# Patient Record
Sex: Male | Born: 1935 | Race: White | Hispanic: No | Marital: Married | Smoking: Never smoker
Health system: Southern US, Community
[De-identification: ages and names within clinical notes are randomized; demographics above are authoritative.]

## PROBLEM LIST (undated history)

## (undated) DIAGNOSIS — M1990 Unspecified osteoarthritis, unspecified site: Secondary | ICD-10-CM

## (undated) DIAGNOSIS — L719 Rosacea, unspecified: Secondary | ICD-10-CM

## (undated) DIAGNOSIS — N200 Calculus of kidney: Secondary | ICD-10-CM

## (undated) DIAGNOSIS — Z973 Presence of spectacles and contact lenses: Secondary | ICD-10-CM

## (undated) DIAGNOSIS — Z974 Presence of external hearing-aid: Secondary | ICD-10-CM

## (undated) DIAGNOSIS — K219 Gastro-esophageal reflux disease without esophagitis: Secondary | ICD-10-CM

## (undated) DIAGNOSIS — E785 Hyperlipidemia, unspecified: Secondary | ICD-10-CM

## (undated) DIAGNOSIS — Z972 Presence of dental prosthetic device (complete) (partial): Secondary | ICD-10-CM

## (undated) DIAGNOSIS — H9190 Unspecified hearing loss, unspecified ear: Secondary | ICD-10-CM

## (undated) DIAGNOSIS — C61 Malignant neoplasm of prostate: Secondary | ICD-10-CM

## (undated) DIAGNOSIS — K08109 Complete loss of teeth, unspecified cause, unspecified class: Secondary | ICD-10-CM

## (undated) DIAGNOSIS — I10 Essential (primary) hypertension: Secondary | ICD-10-CM

## (undated) HISTORY — DX: Rosacea, unspecified: L71.9

## (undated) HISTORY — PX: INGUINAL HERNIA REPAIR: SUR1180

## (undated) HISTORY — DX: Calculus of kidney: N20.0

## (undated) HISTORY — PX: EYE SURGERY: SHX253

## (undated) HISTORY — DX: Hyperlipidemia, unspecified: E78.5

## (undated) HISTORY — DX: Gastro-esophageal reflux disease without esophagitis: K21.9

## (undated) HISTORY — DX: Malignant neoplasm of prostate: C61

## (undated) HISTORY — PX: APPENDECTOMY: SHX54

---

## 2002-08-16 ENCOUNTER — Emergency Department (HOSPITAL_COMMUNITY): Admission: EM | Admit: 2002-08-16 | Discharge: 2002-08-16 | Payer: Self-pay | Admitting: Emergency Medicine

## 2002-08-16 ENCOUNTER — Encounter: Payer: Self-pay | Admitting: Emergency Medicine

## 2002-08-18 ENCOUNTER — Encounter: Payer: Self-pay | Admitting: Internal Medicine

## 2002-08-18 ENCOUNTER — Ambulatory Visit (HOSPITAL_COMMUNITY): Admission: RE | Admit: 2002-08-18 | Discharge: 2002-08-18 | Payer: Self-pay | Admitting: Internal Medicine

## 2003-01-12 ENCOUNTER — Ambulatory Visit (HOSPITAL_COMMUNITY): Admission: RE | Admit: 2003-01-12 | Discharge: 2003-01-12 | Payer: Self-pay | Admitting: Internal Medicine

## 2004-02-15 ENCOUNTER — Ambulatory Visit (HOSPITAL_COMMUNITY): Admission: RE | Admit: 2004-02-15 | Discharge: 2004-02-15 | Payer: Self-pay | Admitting: Internal Medicine

## 2004-03-22 ENCOUNTER — Ambulatory Visit (HOSPITAL_COMMUNITY): Admission: RE | Admit: 2004-03-22 | Discharge: 2004-03-22 | Payer: Self-pay | Admitting: Internal Medicine

## 2004-04-08 ENCOUNTER — Ambulatory Visit (HOSPITAL_COMMUNITY): Admission: RE | Admit: 2004-04-08 | Discharge: 2004-04-08 | Payer: Self-pay | Admitting: Internal Medicine

## 2004-08-07 HISTORY — PX: COLONOSCOPY: SHX174

## 2004-08-07 HISTORY — PX: ESOPHAGOGASTRODUODENOSCOPY: SHX1529

## 2005-02-14 ENCOUNTER — Observation Stay (HOSPITAL_COMMUNITY): Admission: RE | Admit: 2005-02-14 | Discharge: 2005-02-15 | Payer: Self-pay | Admitting: General Surgery

## 2005-05-31 ENCOUNTER — Ambulatory Visit (HOSPITAL_COMMUNITY): Admission: RE | Admit: 2005-05-31 | Discharge: 2005-05-31 | Payer: Self-pay | Admitting: Internal Medicine

## 2005-06-13 ENCOUNTER — Encounter (HOSPITAL_COMMUNITY): Admission: RE | Admit: 2005-06-13 | Discharge: 2005-07-18 | Payer: Self-pay | Admitting: Internal Medicine

## 2006-05-12 IMAGING — CR DG THORACIC SPINE 2V
3 series · 3 of 3 positions shown · non-contrast
Comparison: none

CLINICAL DATA: Posterior chest pain vs. upper back pain over the past 3 weeks, no known injury.
 TWO VIEWS OF THE CHEST ? 02/15/2004
 No comparison.
 FINDINGS
 The heart size is normal.  The thoracic aorta is tortuous and atherosclerotic.  The hila and mediastinal contours are otherwise unremarkable.  Bronchovascular markings are mildly prominent diffusely.  There is a nodular opacity projected over the left lung base on the frontal view, not seen on the lateral view, which may represent the patient?s nipple.  Degenerative changes are present throughout the thoracic spine.
 IMPRESSION
 1. Probable nipple shadow overlying the left lung base on the PA film; repeat examination with nipple marker would be suggested for confirmation.
 2. No acute cardiopulmonary disease.
 THORACIC SPINE, THREE VIEWS
 Twelve rib-bearing thoracic vertebrae demonstrate anatomic posterior alignment.  No fractures identified.  Diffuse hypertrophic and degenerative changes are present throughout the thoracic spine.  Severe degenerative disc disease and spondylosis is also present at the C6-7 level, noted on the Swimmer?s view.
 Diffuse thoracic spondylosis.  No acute skeletal abnormalities.  Severe degenerative disc disease and spondylosis at C6-7.
 The above results were faxed to Dr. Mellee?[REDACTED] at the time of interpretation on 02/15/2004 at 5188 hours.

[view not recorded (1 of 3)]
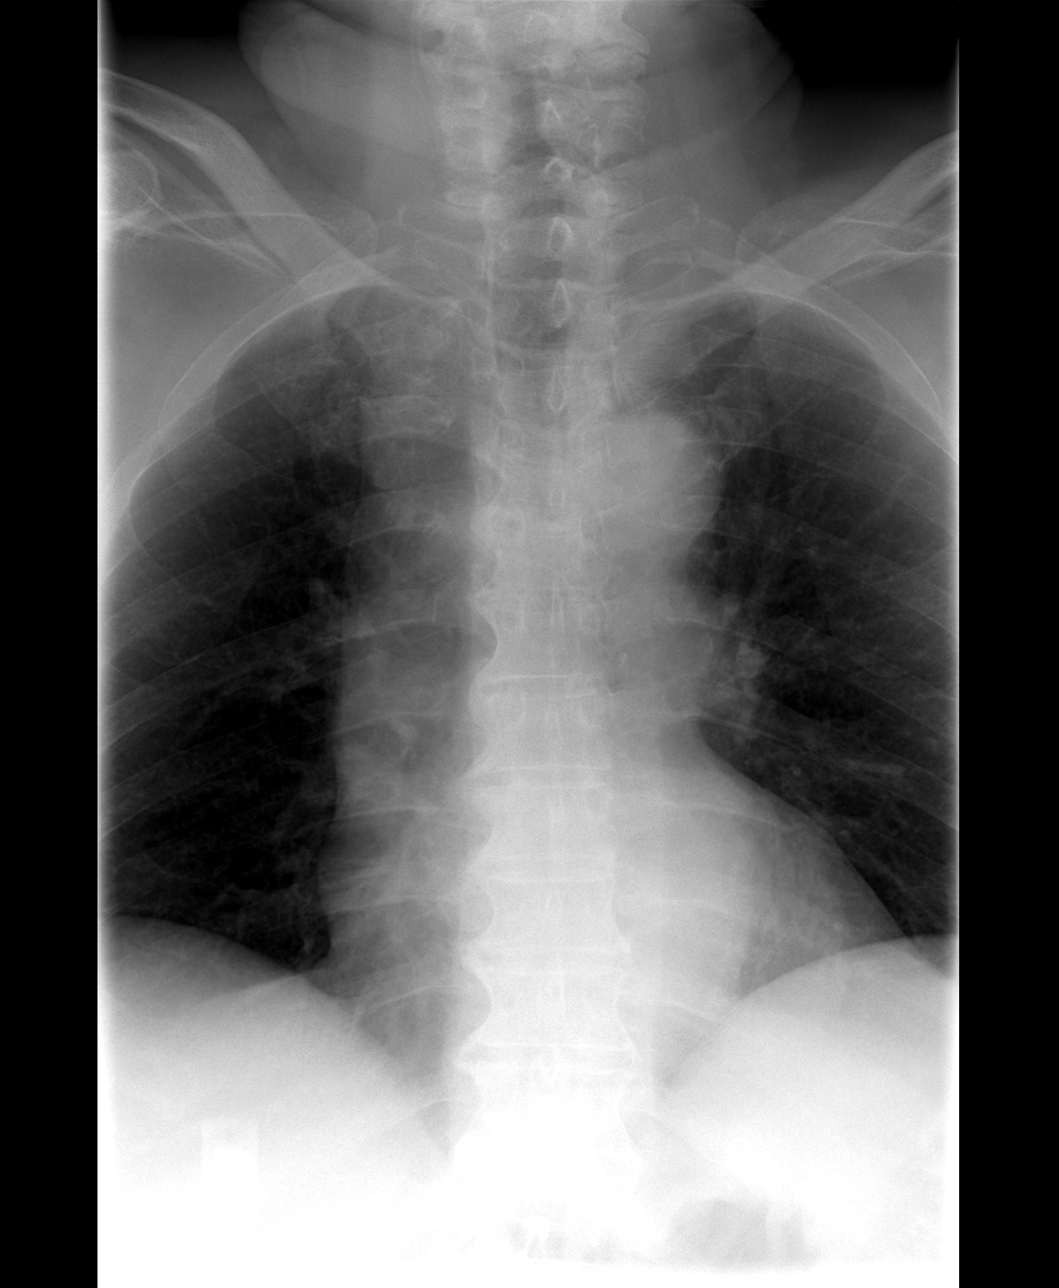

[view not recorded (2 of 3)]
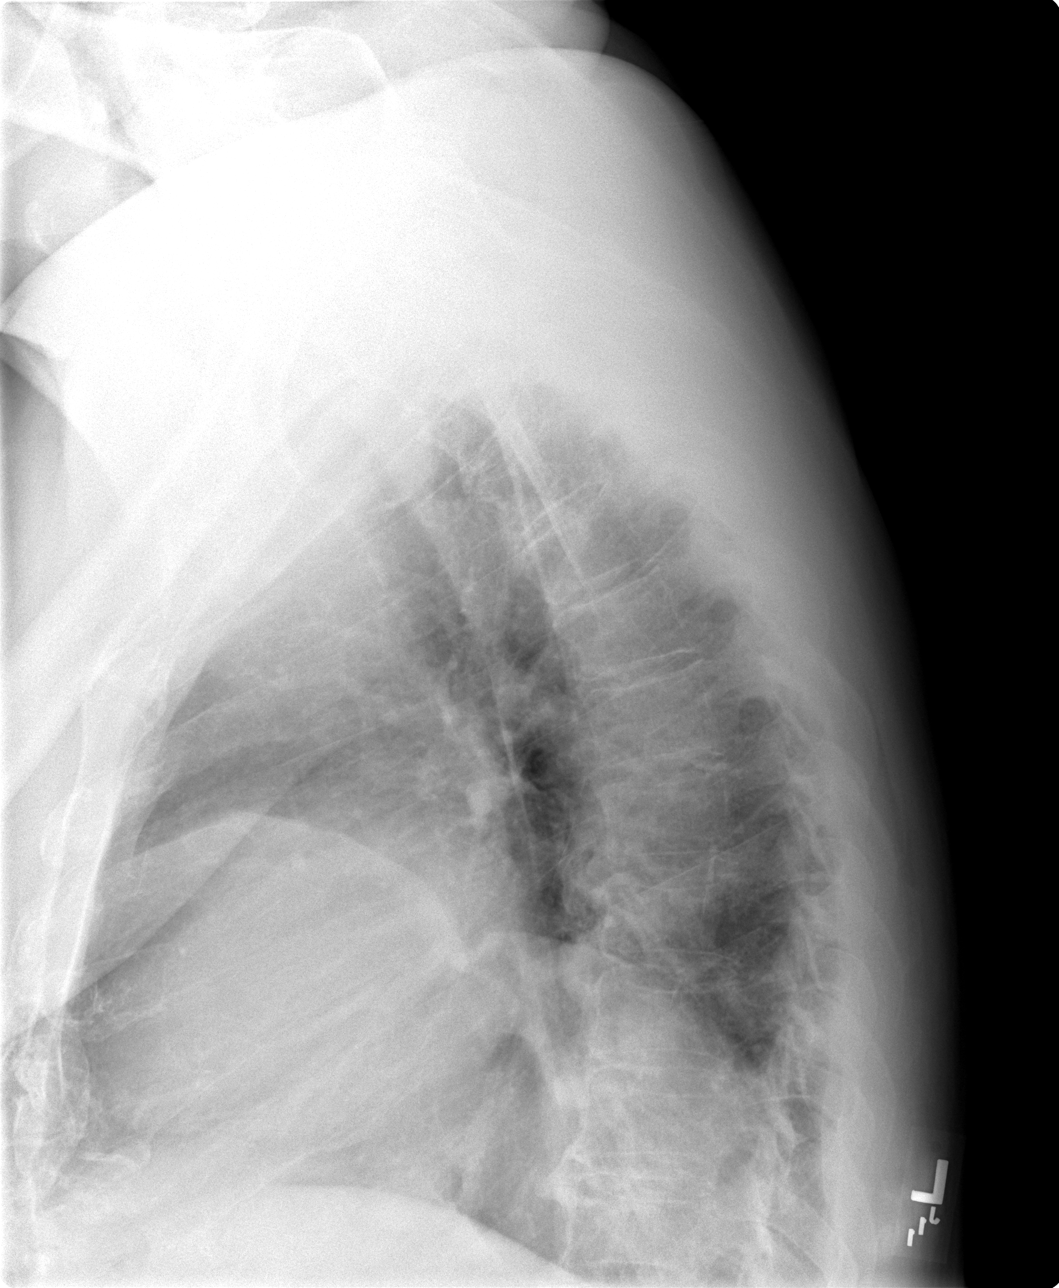

[view not recorded (3 of 3)]
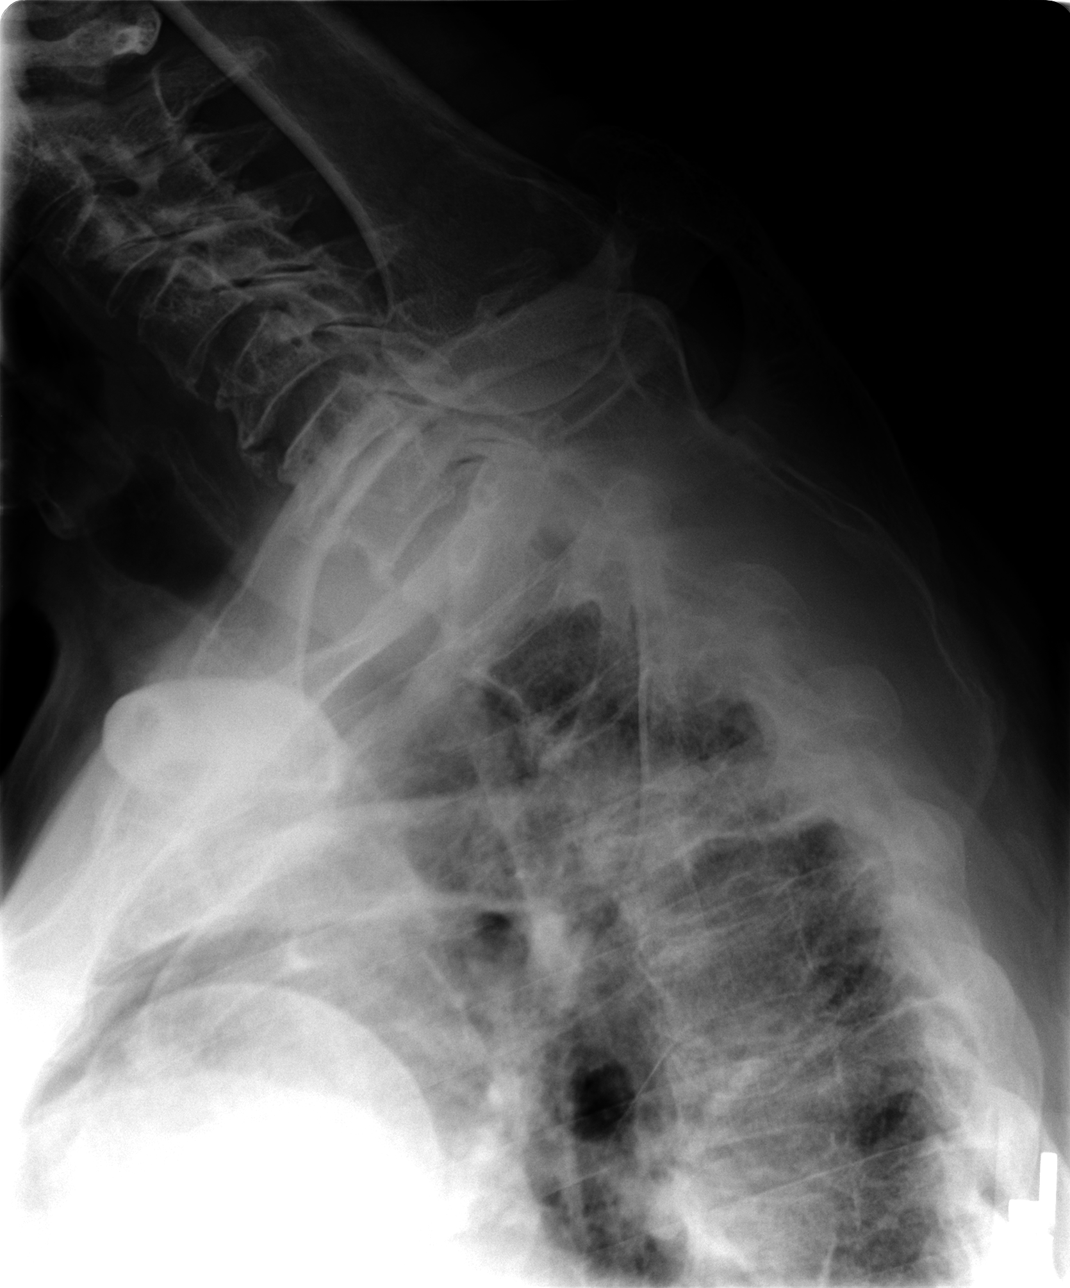

[3 of 3 positions shown; findings below may reference images not displayed]

## 2007-04-30 ENCOUNTER — Ambulatory Visit (HOSPITAL_COMMUNITY): Admission: RE | Admit: 2007-04-30 | Discharge: 2007-04-30 | Payer: Self-pay | Admitting: Internal Medicine

## 2007-08-08 HISTORY — PX: COLONOSCOPY: SHX174

## 2008-02-24 ENCOUNTER — Encounter: Payer: Self-pay | Admitting: Internal Medicine

## 2008-02-24 ENCOUNTER — Ambulatory Visit: Payer: Self-pay | Admitting: Internal Medicine

## 2008-02-24 ENCOUNTER — Ambulatory Visit (HOSPITAL_COMMUNITY): Admission: RE | Admit: 2008-02-24 | Discharge: 2008-02-24 | Payer: Self-pay | Admitting: Internal Medicine

## 2008-04-09 ENCOUNTER — Ambulatory Visit (HOSPITAL_COMMUNITY): Admission: RE | Admit: 2008-04-09 | Discharge: 2008-04-09 | Payer: Self-pay | Admitting: Internal Medicine

## 2010-07-07 ENCOUNTER — Ambulatory Visit (HOSPITAL_COMMUNITY)
Admission: RE | Admit: 2010-07-07 | Discharge: 2010-07-07 | Payer: Self-pay | Source: Home / Self Care | Admitting: Ophthalmology

## 2010-08-29 ENCOUNTER — Ambulatory Visit (HOSPITAL_COMMUNITY)
Admission: RE | Admit: 2010-08-29 | Discharge: 2010-08-29 | Payer: Self-pay | Source: Home / Self Care | Attending: Ophthalmology | Admitting: Ophthalmology

## 2010-08-30 LAB — GLUCOSE, CAPILLARY: Glucose-Capillary: 213 mg/dL — ABNORMAL HIGH (ref 70–99)

## 2010-10-17 LAB — GLUCOSE, CAPILLARY: Glucose-Capillary: 136 mg/dL — ABNORMAL HIGH (ref 70–99)

## 2010-10-18 LAB — BASIC METABOLIC PANEL
BUN: 12 mg/dL (ref 6–23)
CO2: 29 mEq/L (ref 19–32)
Calcium: 9.6 mg/dL (ref 8.4–10.5)
Chloride: 104 mEq/L (ref 96–112)
Creatinine, Ser: 0.88 mg/dL (ref 0.4–1.5)
GFR calc Af Amer: >60 mL/min (ref >60)
GFR calc non Af Amer: >60 mL/min (ref >60)
Glucose, Bld: 91 mg/dL (ref 70–99)
Potassium: 4.1 mEq/L (ref 3.5–5.1)
Sodium: 141 mEq/L (ref 135–145)

## 2010-10-18 LAB — HEMOGLOBIN AND HEMATOCRIT, BLOOD
HCT: 38.3 % — ABNORMAL LOW (ref 39.0–52.0)
Hemoglobin: 13.6 g/dL (ref 13.0–17.0)

## 2010-12-20 NOTE — Op Note (Signed)
NAME:  Ian Mccann, Ian Mccann                  ACCOUNT NO.:  [id number]   MEDICAL RECORD NO.:  [id number]          PATIENT TYPE:  AMB   LOCATION:  DAY                           FACILITY:  [facility]   PHYSICIAN:  R. Roetta Sessions, M.D. DATE OF BIRTH:  1929-05-11   DATE OF PROCEDURE:  DATE OF DISCHARGE:                               OPERATIVE REPORT   INDICATIONS FOR PROCEDURE:  The patient is a 78-year gentleman with a  history of colonic adenomas removed in 2004 and 2001.  He is here for  surveillance.  He does not have any lower GI tract symptoms.  He denies  any major medical issues in the past 5 years except for inguinal hernia  repair.  He does not have any lower GI tract symptoms.  Colonoscopy is  now being done.  This approach was discussed the patient at length.  Risks, benefits, and alternatives have been reviewed, questions  answered.  He is agreeable.  Please see documentation in the medical  record.   PROCEDURE NOTE:  O2 saturation, blood pressure, and pulse of the patient  monitored throughout the entire procedure.  Conscious sedation, Versed 3  mg IV and Demerol 75 mg IV in divided doses.   INSTRUMENT:  Pentax video chip system.   FINDINGS:  Digital rectal exam revealed no abnormalities.  Endoscopic  findings: The prep was adequate.  Colon:  Colonic mucosa was surveyed  from the rectosigmoid junction through the left transverse, right colon,  appendiceal orifice, ileocecal valve, and cecum.  These structures were  well seen and photographed for the record.  From this level, scope was  slowly withdrawn.  All previously mentioned mucosal surfaces were again  seen.  The patient had two diminutive polyps in the base of the cecum,  cold biopsied/removed.  At the splenic flexure, he had two 8-mm  pedunculated polyps, which were both hot snared and recovered through  the scope.  He had also narrow mouthed shallow left-sided diverticula as  well.  Remainder of colonic mucosa appeared normal.   The scope was  pulled down to the rectum with examination of rectal mucosa, including  retroflexed view of the anal verge, demonstrated no abnormalities.  The  patient tolerated the procedure well and was reacted in Endoscopy.   IMPRESSION:  1. Normal rectum.  2. Shallow narrow-mouth left-sided diverticula.  3. Splenic flexure polyp, status post snare polypectomy.  4. Diminutive cecal polyp, status post cold biopsy removal.  5. Colonic mucosa appeared normal.   RECOMMENDATIONS:  1. Follow up diverticulosis.  Literature provided with Mr. Skillin.  2. Follow up on path.  3. Further recommendations to follow.      Jonathon Bellows, M.D.  Electronically Signed     RMR/MEDQ  D:  02/24/2008  T:  02/25/2008  Job:  [id number]   cc:   Kingsley Callander. Ouida Sills, MD  Fax: [phone]

## 2010-12-23 NOTE — Op Note (Signed)
NAME:  Ian Mccann, Ian Mccann                  ACCOUNT NO.:  [id number]   MEDICAL RECORD NO.:  [id number]          PATIENT TYPE:  AMB   LOCATION:  DAY                           FACILITY:  [facility]   PHYSICIAN:  Jerolyn Shin C. Katrinka Blazing, M.D.   DATE OF BIRTH:  1929/02/26   DATE OF PROCEDURE:  02/14/2005  DATE OF DISCHARGE:                                 OPERATIVE REPORT   PREOPERATIVE DIAGNOSIS:  Recurrent right inguinal hernia.   POSTOPERATIVE DIAGNOSIS:  Recurrent right inguinal hernia.   OPERATION/PROCEDURE:  Repair recurrent right inguinal hernia with mesh  graft.   SURGEON:  Dirk Dress. Katrinka Blazing, M.D.   DESCRIPTION OF PROCEDURE:  Under spinal anesthesia, the right inguinal area  was prepped and draped in the sterile field.  The old incision was opened.  Incision was extended down to the aponeurosis.  The aponeurosis was very  thickened  and scarred down.  It was gently dissected until the cord could  be identified.  The cord was separated from the surrounding structures.  There was a large lipoma pushing through the inguinal floor medial to the  cord.  This was dissected away from the cord and fixated through the  inguinal floor in a more medial position close to the pubic tubercle.  There  were some sutures present which looked like Ethibond or similar type of  suture.  I could not actually determine the type of repair though it appears  that it may have been a Engineer, manufacturing or an old Barista.  The inguinal  floor was dissected.  The transverse segment of the fascia was followed down  to Cooper's ligament.  Once this was done, the remnant of the conjoin tendon  was identified.  The large lipoma was invaginated and a large mesh plug was  placed and sutured in place with interrupted 0 Prolene.  An onlay patch was  placed.  It was sutured to Cooper's ligament and the ileopubic tract as well  as the remnant of the conjoined tendon.  It was sutured lateral to the cord.  It should be noted that the cord  was explored and there was no evidence of  an indirect component.  The cord was placed in the anatomic position over  the repair.  The aponeurosis was closed with 2-0 Monocryl.  Subcutaneous  tissue was closed with 3-0 Monocryl.  Skin was closed with staples.  Local  infiltration  with 0.5% Marcaine with epinephrine was carried out.  Dressing of Op-Site  was placed.  The patient tolerated the procedure well.  He was awakened from  anesthesia, transferred to a bed and taken to the post anesthesia care unit  for monitoring.       LCS/MEDQ  D:  02/14/2005  T:  02/14/2005  Job:  [id number]   cc:   Barbaraann Barthel, M.D.  [address]  [place]  [place] [address]  Fax: [phone]   Kingsley Callander. Ouida Sills, MD  [address]  [place]  [place] [address]  Fax: [phone]

## 2010-12-23 NOTE — Op Note (Signed)
NAME:  Ian Mccann, Ian Mccann                            ACCOUNT NO.:  [id number]   MEDICAL RECORD NO.:  [id number]                   PATIENT TYPE:  AMB   LOCATION:  DAY                                  FACILITY:  [facility]   PHYSICIAN:  R. Roetta Sessions, M.D.              DATE OF BIRTH:  1928/10/13   DATE OF PROCEDURE:  01/12/2003  DATE OF DISCHARGE:                                 OPERATIVE REPORT   PROCEDURE:  Esophagogastroduodenoscopy with Elease Hashimoto dilation followed by  colonoscopy with biopsy.   INDICATIONS FOR PROCEDURE:  The patient is a 77 year old gentleman who  underwent colonoscopy in 2001 and had a pedunculated polyp in his rectum  removed. He comes now for surveillance, also has had had a 77 year old  history of gastroesophageal reflux disease and intermittent esophageal  dysphagia. Reflux symptoms well controlled on Nexium 40 mg orally daily. EGD  is now being done to further evaluate his longstanding reflux and dysphagia  and he is now undergoing surveillance colonoscopy. This approach has been  discussed with the patient at length at the bedside. The potential risks,  benefits, and alternatives have been reviewed, questions answered. He is  agreeable. Please see my handwritten H&P for more information.   MONITORING:  O2 saturation, blood pressure, pulse, and respirations were  monitored throughout the entirety of both procedures.   CONSCIOUS SEDATION:  For both procedures was Versed 2 mg IV, Demerol 50 mg  IV.   INSTRUMENTS:  Olympus video chip adult gastroscope and colonoscope.   EGD FINDINGS:  Examination of the tubular esophagus revealed a subtle  noncritical appearing submucosal ring at the EG junction. The esophageal  mucosa otherwise appeared normal. No evidence of Barrett's esophagus.  The  EG junction was easily traversed with the scope.   STOMACH:  The gastric cavity was empty and insufflated well with air. A  thorough examination of the gastric mucosa including a  retroflexed view of  the proximal stomach and esophagogastric junction demonstrated focal antral  erosions and some white verrucous appearing mucosa in the this area. This  whitish material would not wash off. There was no ulcer infiltrating process  seen. The pylorus was patent and easily traversed.   DUODENUM:  Examination of the bulb and second portion revealed no  abnormalities.   THERAPEUTIC/DIAGNOSTIC MANEUVERS:  A 56 French Maloney dilator was passed to  full insertion with good patient tolerance.  A look back revealed no  apparent complications related to passage of the dilator. Subsequently the  area of abnormality in the antrum was biopsied multiple times for histologic  studies.   The patient tolerated the procedure well and was prepared for colonoscopy.   Digital rectal exam revealed no abnormalities.   ENDOSCOPIC FINDINGS:  The prep was good.   RECTUM:  Examination of the rectal mucosa including retroflexed view of the  anal verge revealed no abnormalities.   COLON:  The colonic mucosa was surveyed from the rectosigmoid junction  through the left transverse right colon to the area of the appendiceal  orifice, ileocecal valve and cecum. These structures were seen and  photographed for the record. The patient had two 5 mm polyps at 40 cm which  were cold biopsied/removed, remainder of the colonic mucosa appeared normal  from the level of the cecum and ileocecal valve. The scope was slowly and  cautiously withdrawn. All previously mentioned mucosal surfaces were again  seen and no other abnormalities were observed. The patient tolerated both  procedures well and was reacted in endoscopy.   IMPRESSION:  1. Esophagogastroduodenoscopy, subtle submucosal ring status post dilation     as described above, otherwise, normal esophagus.  2. Verrucous appearing mucosa with focal erosions in the antrum of uncertain     clinical significance biopsied. The remainder of the gastric  mucosa     appeared normal. Normal D1, D2.   COLONOSCOPY FINDINGS:  1. Normal rectum.  2. Small polyps at 40 cm cold biopsied/removed. The remainder of the colonic     mucosa appeared normal.   RECOMMENDATIONS:  1. Continue Nexium 40 mg orally daily, will followup on path.  2. Further recommendations to follow.                                               Ian Mccann, M.D.    RMR/MEDQ  D:  01/12/2003  T:  01/12/2003  Job:  [id number]   cc:   Kingsley Callander. Ouida Sills, M.D.  [address]  [place]  [place] [address]  Fax: [phone]

## 2010-12-23 NOTE — H&P (Signed)
NAME:  Ian Mccann, Ian Mccann                  ACCOUNT NO.:  [id number]   MEDICAL RECORD NO.:  [id number]          PATIENT TYPE:  AMB   LOCATION:  DAY                           FACILITY:  [facility]   PHYSICIAN:  Jerolyn Shin C. Katrinka Blazing, M.D.   DATE OF BIRTH:  1929-04-29   DATE OF ADMISSION:  DATE OF DISCHARGE:  LH                                HISTORY & PHYSICAL   A 77 year old male with history of painful swelling in his right groin for  up to two years. He has not noticed a mass. The patient had a right inguinal  hernia repair done in 1990 by Dr. Malvin Johns. He is having increasing symptoms  and is scheduled for repair of recurrent right inguinal hernia.   PAST HISTORY:  1.  He has diabetes mellitus.  2.  Chronic bronchitis.  3.  Gastroesophageal reflux disease.  4.  Constipation.   MEDICATIONS:  Medications are listed, but doses are not given. Medications  include:  1.  Nexium 40 mg daily.  2.  Metformin 1,000 mg twice daily.  3.  Mavik 2 mg daily.  4.  Lipitor 20 mg q.h.s.  5.  Doxazosin 8 mg daily.  6.  Aspirin 325 mg daily.  7.  Darvocet N 100 p.r.n.  8.  Glycalox preparation for constipation.  9.  Citrucel caplets 4 a day.  10. Centrum Silver 1 day.  11. Vitamin E 1 a day.   SURGERY:  1.  Left hydrocele, left inguinal hernia 1968.  2.  Right inguinal hernia 1990.   SOCIAL HISTORY:  He is retired Geophysicist/field seismologist. Married. Does not drink, smoke  or use drugs.   PHYSICAL EXAMINATION:  VITAL SIGNS:  On exam, blood pressure 124/68, pulse  62, respirations 20, weight 204 pounds.  HEENT is unremarkable.  NECK:  Supple. No JVD, bruit, adenopathy or thyromegaly.  CHEST:  Clear to auscultation.  HEART:  Regular rate and rhythm without murmur, gallop or rub.  ABDOMEN:  Soft, nontender, no masses.  GENITOURINARY:  Genitalia exam reveals a right inguinal hernia and a left  varicocele. Otherwise genitalia unremarkable.   IMPRESSION:  1.  Right inguinal hernia, recurrent.  2.  Hypertension.  3.   Gastroesophageal reflux disease.  4.  Benign prostatic hypertrophy.  5.  Diabetes mellitus.  6.  Chronic bronchitis.  7.  Chronic constipation.   PLAN:  Repair of recurrent right inguinal hernia.       LCS/MEDQ  D:  02/13/2005  T:  02/14/2005  Job:  [id number]   cc:   [facility]  Fax: [phone]

## 2011-07-18 ENCOUNTER — Ambulatory Visit (INDEPENDENT_AMBULATORY_CARE_PROVIDER_SITE_OTHER): Payer: Medicare PPO | Admitting: Urology

## 2011-07-18 ENCOUNTER — Other Ambulatory Visit: Payer: Self-pay | Admitting: Urology

## 2011-07-18 DIAGNOSIS — R972 Elevated prostate specific antigen [PSA]: Secondary | ICD-10-CM

## 2011-07-18 DIAGNOSIS — N401 Enlarged prostate with lower urinary tract symptoms: Secondary | ICD-10-CM

## 2011-07-18 DIAGNOSIS — N402 Nodular prostate without lower urinary tract symptoms: Secondary | ICD-10-CM

## 2011-08-08 DIAGNOSIS — C61 Malignant neoplasm of prostate: Secondary | ICD-10-CM

## 2011-08-08 HISTORY — DX: Malignant neoplasm of prostate: C61

## 2011-08-22 ENCOUNTER — Other Ambulatory Visit: Payer: Self-pay | Admitting: Urology

## 2011-08-22 ENCOUNTER — Encounter (HOSPITAL_COMMUNITY): Payer: Medicare PPO

## 2011-08-22 ENCOUNTER — Ambulatory Visit (HOSPITAL_COMMUNITY)
Admission: RE | Admit: 2011-08-22 | Discharge: 2011-08-22 | Disposition: A | Discharge status: Home / Self Care | Payer: Medicare PPO | Source: Ambulatory Visit | Attending: Urology | Admitting: Urology

## 2011-08-22 DIAGNOSIS — N402 Nodular prostate without lower urinary tract symptoms: Secondary | ICD-10-CM

## 2011-08-23 ENCOUNTER — Other Ambulatory Visit: Payer: Self-pay | Admitting: Urology

## 2011-09-01 ENCOUNTER — Other Ambulatory Visit: Payer: Self-pay | Admitting: Urology

## 2011-09-01 DIAGNOSIS — C61 Malignant neoplasm of prostate: Secondary | ICD-10-CM

## 2011-09-05 ENCOUNTER — Encounter (HOSPITAL_COMMUNITY)
Admission: RE | Admit: 2011-09-05 | Discharge: 2011-09-05 | Disposition: A | Discharge status: Home / Self Care | Payer: Medicare PPO | Source: Ambulatory Visit | Attending: Urology | Admitting: Urology

## 2011-09-05 ENCOUNTER — Encounter (HOSPITAL_COMMUNITY): Payer: Self-pay

## 2011-09-05 ENCOUNTER — Ambulatory Visit (HOSPITAL_COMMUNITY)
Admission: RE | Admit: 2011-09-05 | Discharge: 2011-09-05 | Disposition: A | Discharge status: Home / Self Care | Payer: Medicare PPO | Source: Ambulatory Visit | Attending: Urology | Admitting: Urology

## 2011-09-05 DIAGNOSIS — C61 Malignant neoplasm of prostate: Secondary | ICD-10-CM | POA: Insufficient documentation

## 2011-09-05 DIAGNOSIS — Q619 Cystic kidney disease, unspecified: Secondary | ICD-10-CM | POA: Insufficient documentation

## 2011-09-05 LAB — POCT I-STAT, CHEM 8
BUN: 17 mg/dL (ref 6–23)
Calcium, Ion: 1.04 mmol/L — ABNORMAL LOW (ref 1.12–1.32)
Chloride: 106 mEq/L (ref 96–112)
Creatinine, Ser: 0.8 mg/dL (ref 0.50–1.35)
Glucose, Bld: 229 mg/dL — ABNORMAL HIGH (ref 70–99)
HCT: 40 % (ref 39.0–52.0)
Hemoglobin: 13.6 g/dL (ref 13.0–17.0)
Potassium: 4.2 mEq/L (ref 3.5–5.1)
Sodium: 138 mEq/L (ref 135–145)
TCO2: 22 mmol/L (ref 0–100)

## 2011-09-05 MED ORDER — IOHEXOL 300 MG/ML  SOLN
100.0000 mL | Freq: Once | INTRAMUSCULAR | Status: AC | PRN
  Administered 2011-09-05: 100 mL via INTRAVENOUS

## 2011-09-05 MED ORDER — TECHNETIUM TC 99M MEDRONATE IV KIT
25.0000 | PACK | Freq: Once | INTRAVENOUS | Status: AC | PRN
  Administered 2011-09-05: 25.5 via INTRAVENOUS

## 2011-09-26 ENCOUNTER — Ambulatory Visit (INDEPENDENT_AMBULATORY_CARE_PROVIDER_SITE_OTHER): Payer: Medicare PPO | Admitting: Urology

## 2011-09-26 DIAGNOSIS — C61 Malignant neoplasm of prostate: Secondary | ICD-10-CM

## 2011-10-11 ENCOUNTER — Other Ambulatory Visit: Payer: Self-pay | Admitting: Radiation Oncology

## 2011-10-31 ENCOUNTER — Ambulatory Visit (INDEPENDENT_AMBULATORY_CARE_PROVIDER_SITE_OTHER): Payer: Medicare PPO | Admitting: Urology

## 2011-10-31 DIAGNOSIS — C61 Malignant neoplasm of prostate: Secondary | ICD-10-CM

## 2012-03-05 ENCOUNTER — Ambulatory Visit (INDEPENDENT_AMBULATORY_CARE_PROVIDER_SITE_OTHER): Payer: Medicare PPO | Admitting: Urology

## 2012-03-05 DIAGNOSIS — C61 Malignant neoplasm of prostate: Secondary | ICD-10-CM

## 2012-06-04 ENCOUNTER — Ambulatory Visit (INDEPENDENT_AMBULATORY_CARE_PROVIDER_SITE_OTHER): Payer: Medicare PPO | Admitting: Urology

## 2012-06-04 DIAGNOSIS — R3915 Urgency of urination: Secondary | ICD-10-CM

## 2012-06-04 DIAGNOSIS — C61 Malignant neoplasm of prostate: Secondary | ICD-10-CM

## 2012-09-03 ENCOUNTER — Ambulatory Visit (INDEPENDENT_AMBULATORY_CARE_PROVIDER_SITE_OTHER): Payer: Medicare Other | Admitting: Urology

## 2012-09-03 DIAGNOSIS — C61 Malignant neoplasm of prostate: Secondary | ICD-10-CM

## 2012-09-03 DIAGNOSIS — N3941 Urge incontinence: Secondary | ICD-10-CM

## 2012-12-31 ENCOUNTER — Ambulatory Visit (INDEPENDENT_AMBULATORY_CARE_PROVIDER_SITE_OTHER): Payer: Medicare Other | Admitting: Urology

## 2012-12-31 DIAGNOSIS — N3941 Urge incontinence: Secondary | ICD-10-CM

## 2012-12-31 DIAGNOSIS — C61 Malignant neoplasm of prostate: Secondary | ICD-10-CM

## 2013-03-13 ENCOUNTER — Ambulatory Visit (INDEPENDENT_AMBULATORY_CARE_PROVIDER_SITE_OTHER): Payer: Medicare Other | Admitting: Gastroenterology

## 2013-03-13 ENCOUNTER — Encounter: Payer: Self-pay | Admitting: Gastroenterology

## 2013-03-13 VITALS — BP 129/72 | HR 67 | Temp 97.0°F | Ht 70.0 in | Wt 222.0 lb

## 2013-03-13 DIAGNOSIS — R1314 Dysphagia, pharyngoesophageal phase: Secondary | ICD-10-CM

## 2013-03-13 DIAGNOSIS — K862 Cyst of pancreas: Secondary | ICD-10-CM

## 2013-03-13 DIAGNOSIS — R1319 Other dysphagia: Secondary | ICD-10-CM

## 2013-03-13 DIAGNOSIS — Z860101 Personal history of adenomatous and serrated colon polyps: Secondary | ICD-10-CM

## 2013-03-13 DIAGNOSIS — D649 Anemia, unspecified: Secondary | ICD-10-CM

## 2013-03-13 DIAGNOSIS — R1310 Dysphagia, unspecified: Secondary | ICD-10-CM

## 2013-03-13 DIAGNOSIS — K863 Pseudocyst of pancreas: Secondary | ICD-10-CM

## 2013-03-13 DIAGNOSIS — Z86010 Personal history of colonic polyps: Secondary | ICD-10-CM

## 2013-03-13 MED ORDER — PEG 3350-KCL-NA BICARB-NACL 420 G PO SOLR: 4000.0000 mL | ORAL | Status: DC

## 2013-03-13 NOTE — Patient Instructions (Addendum)
1. We have scheduled you for a colonoscopy and upper endoscopy with Dr. Jena Gauss. Please see separate instructions. 2. 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

## 2013-03-13 NOTE — Progress Notes (Signed)
Primary Care Physician:  Carylon Perches, MD  Primary Gastroenterologist:  Roetta Sessions, MD  Chief Complaint  Patient presents with  . Colonoscopy  . Dysphagia    HPI:  Ian Mccann is a 79 y.o. male here to schedule his surveillance colonoscopy and for some difficulty swallowing. His last colonoscopy was in 2009, he had a tubular adenoma removed. History of colon polyps on prior TCS in 2006 as well. He c/o difficulty swallowing larger pills. Denies problems with food at this point. Denies heartburn. He is on omeprazole 40mg  daily. No constipation, diarrhea, melena, brbpr, n/v, abdominal pain. No weight loss.  Patient had CT A/P after diagnosis with prostate cancer in 08/2011. He had fatty replacement of portions of the pancreas and a 2 cm cyst arising superiorly from the pancreatic tail. F/u CT in 6 months recommended but has not been done.    Current Outpatient Prescriptions  Medication Sig Dispense Refill  . aspirin 81 MG tablet Take 81 mg by mouth daily.      Marland Kitchen atorvastatin (LIPITOR) 20 MG tablet       . b complex vitamins tablet Take 1 tablet by mouth daily.      . Coenzyme Q10 (CO Q 10 PO) Take by mouth.      . dextromethorphan 15 MG/5ML syrup Take 10 mLs by mouth 4 (four) times daily as needed for cough.      . donepezil (ARICEPT) 10 MG tablet       . doxazosin (CARDURA) 4 MG tablet       . glimepiride (AMARYL) 1 MG tablet       . loratadine (CLARITIN) 10 MG tablet Take 10 mg by mouth daily.      . metFORMIN (GLUCOPHAGE) 500 MG tablet Take 500 mg by mouth 2 (two) times daily with a meal.      . Misc Natural Products (OSTEO BI-FLEX ADV TRIPLE ST) TABS Take by mouth.      . Multiple Vitamins-Minerals (CENTRUM SILVER PO) Take by mouth.      Marland Kitchen omeprazole (PRILOSEC) 40 MG capsule       . VESICARE 5 MG tablet       . vitamin E 400 UNIT capsule Take 400 Units by mouth daily.       No current facility-administered medications for this visit.    Allergies as of 03/13/2013  . (No Known  Allergies)    Past Medical History  Diagnosis Date  . Prostate cancer 08/2011    XRT  . Diabetes mellitus   . Hyperlipidemia   . Kidney stones   . Rosacea   . GERD (gastroesophageal reflux disease)     Past Surgical History  Procedure Laterality Date  . Colonoscopy  2009    Dr. Rourk--> left-sided diverticulosis, splenic flexure polyp (tubular adenoma), cecal polyp.  . Esophagogastroduodenoscopy  2006    Dr. Rourk--> subtle submucosal ring status post dilation, varicose mucosa with focal erosions in the antrum  . Colonoscopy  2006    Dr. Marcell Anger  . Inguinal hernia repair      X 3    Family History  Problem Relation Age of Onset  . Colon cancer Neg Hx   . Cancer Brother     unknown   . Pancreatic cancer Brother     History   Social History  . Marital Status: Married    Spouse Name: N/A    Number of Children: 5  . Years of Education: N/A   Occupational History  .  Social History Main Topics  . Smoking status: Never Smoker   . Smokeless tobacco: Not on file  . Alcohol Use: No  . Drug Use: No  . Sexually Active: Not on file   Other Topics Concern  . Not on file   Social History Narrative  . No narrative on file      ROS:  General: Negative for anorexia, weight loss, fever, chills, fatigue, weakness. Eyes: Negative for vision changes.  ENT: Negative for hoarseness, nasal congestion. CV: Negative for chest pain, angina, palpitations, dyspnea on exertion, peripheral edema.  Respiratory: Negative for dyspnea at rest, dyspnea on exertion, cough, sputum, wheezing.  GI: See history of present illness. GU:  Negative for dysuria, hematuria, urinary incontinence, urinary frequency, nocturnal urination.  MS: Negative for joint pain, low back pain.  Derm: Negative for rash or itching.  Neuro: Negative for weakness, abnormal sensation, seizure, frequent headaches, memory loss, confusion.  Psych: Negative for anxiety, depression, suicidal ideation,  hallucinations.  Endo: Negative for unusual weight change.  Heme: Negative for bruising or bleeding. Allergy: Negative for rash or hives.    Physical Examination:  BP 129/72  Pulse 67  Temp(Src) 97 F (36.1 C) (Oral)  Ht 5\' 10"  (1.778 m)  Wt 222 lb (100.699 kg)  BMI 31.85 kg/m2   General: Well-nourished, well-developed in no acute distress. Accompanied by wife.  Head: Normocephalic, atraumatic.   Eyes: Conjunctiva pink, no icterus. Mouth: Oropharyngeal mucosa moist and pink , no lesions erythema or exudate. Neck: Supple without thyromegaly, masses, or lymphadenopathy.  Lungs: Clear to auscultation bilaterally.  Heart: Regular rate and rhythm, no murmurs rubs or gallops.  Abdomen: Bowel sounds are normal, nontender, nondistended, no hepatosplenomegaly or masses, no abdominal bruits or    hernia , no rebound or guarding.   Rectal: not performed Extremities: No lower extremity edema. No clubbing or deformities.  Neuro: Alert and oriented x 4 , grossly normal neurologically.  Skin: Warm and dry, no rash or jaundice.   Psych: Alert and cooperative, normal mood and affect.  Labs: 02/2013: WBC 5200, H/H 11.2/31.9, Platelet 174000, Cre 0.98, Tbili 0.5, AP 75, AST 18, ALT17, alb 3.7, HgbA1C 8.3.  Imaging Studies: No results found.

## 2013-03-14 ENCOUNTER — Encounter: Payer: Self-pay | Admitting: Gastroenterology

## 2013-03-14 DIAGNOSIS — D649 Anemia, unspecified: Secondary | ICD-10-CM | POA: Insufficient documentation

## 2013-03-14 NOTE — Assessment & Plan Note (Signed)
After EGD/TCS, we will plan for f/u CT Abd with pancreatic protocol.

## 2013-03-14 NOTE — Assessment & Plan Note (Signed)
Vague dysphagia to large pills. H/o submucosal esophageal ring in 2006. Offered upper endoscopy with dilation in the near future with Dr. Jena Gauss.  I have discussed the risks, alternatives, benefits with regards to but not limited to the risk of reaction to medication, bleeding, infection, perforation and the patient is agreeable to proceed. Written consent to be obtained.  Continue omeprazole daily.

## 2013-03-14 NOTE — Assessment & Plan Note (Signed)
Due for surveillance colonoscopy at this time.  I have discussed the risks, alternatives, benefits with regards to but not limited to the risk of reaction to medication, bleeding, infection, perforation and the patient is agreeable to proceed. Written consent to be obtained.  

## 2013-03-14 NOTE — Assessment & Plan Note (Signed)
Mild normocytic anemia. EGD/TCS as planned.

## 2013-03-17 ENCOUNTER — Encounter (HOSPITAL_COMMUNITY): Payer: Self-pay | Admitting: Pharmacy Technician

## 2013-03-17 NOTE — Progress Notes (Signed)
CC'd to PCP 

## 2013-03-31 ENCOUNTER — Other Ambulatory Visit: Payer: Self-pay | Admitting: Internal Medicine

## 2013-03-31 ENCOUNTER — Encounter (HOSPITAL_COMMUNITY)
Admission: RE | Disposition: A | Discharge status: Home / Self Care | Payer: Self-pay | Source: Ambulatory Visit | Attending: Internal Medicine

## 2013-03-31 ENCOUNTER — Ambulatory Visit (HOSPITAL_COMMUNITY)
Admission: RE | Admit: 2013-03-31 | Discharge: 2013-03-31 | Disposition: A | Discharge status: Home / Self Care | Payer: Medicare Other | Source: Ambulatory Visit | Attending: Internal Medicine | Admitting: Internal Medicine

## 2013-03-31 ENCOUNTER — Encounter (HOSPITAL_COMMUNITY): Payer: Self-pay | Admitting: *Deleted

## 2013-03-31 DIAGNOSIS — K6289 Other specified diseases of anus and rectum: Secondary | ICD-10-CM | POA: Insufficient documentation

## 2013-03-31 DIAGNOSIS — Z1211 Encounter for screening for malignant neoplasm of colon: Secondary | ICD-10-CM

## 2013-03-31 DIAGNOSIS — K219 Gastro-esophageal reflux disease without esophagitis: Secondary | ICD-10-CM | POA: Insufficient documentation

## 2013-03-31 DIAGNOSIS — Z860100 Personal history of colon polyps, unspecified: Secondary | ICD-10-CM

## 2013-03-31 DIAGNOSIS — K862 Cyst of pancreas: Secondary | ICD-10-CM | POA: Insufficient documentation

## 2013-03-31 DIAGNOSIS — Y842 Radiological procedure and radiotherapy as the cause of abnormal reaction of the patient, or of later complication, without mention of misadventure at the time of the procedure: Secondary | ICD-10-CM | POA: Insufficient documentation

## 2013-03-31 DIAGNOSIS — E785 Hyperlipidemia, unspecified: Secondary | ICD-10-CM | POA: Insufficient documentation

## 2013-03-31 DIAGNOSIS — Z7982 Long term (current) use of aspirin: Secondary | ICD-10-CM | POA: Insufficient documentation

## 2013-03-31 DIAGNOSIS — Y929 Unspecified place or not applicable: Secondary | ICD-10-CM | POA: Insufficient documentation

## 2013-03-31 DIAGNOSIS — Z79899 Other long term (current) drug therapy: Secondary | ICD-10-CM | POA: Insufficient documentation

## 2013-03-31 DIAGNOSIS — D131 Benign neoplasm of stomach: Secondary | ICD-10-CM | POA: Insufficient documentation

## 2013-03-31 DIAGNOSIS — C61 Malignant neoplasm of prostate: Secondary | ICD-10-CM | POA: Insufficient documentation

## 2013-03-31 DIAGNOSIS — R1310 Dysphagia, unspecified: Secondary | ICD-10-CM | POA: Insufficient documentation

## 2013-03-31 DIAGNOSIS — K449 Diaphragmatic hernia without obstruction or gangrene: Secondary | ICD-10-CM

## 2013-03-31 DIAGNOSIS — Z923 Personal history of irradiation: Secondary | ICD-10-CM | POA: Insufficient documentation

## 2013-03-31 DIAGNOSIS — D126 Benign neoplasm of colon, unspecified: Secondary | ICD-10-CM | POA: Insufficient documentation

## 2013-03-31 DIAGNOSIS — E119 Type 2 diabetes mellitus without complications: Secondary | ICD-10-CM | POA: Insufficient documentation

## 2013-03-31 DIAGNOSIS — L719 Rosacea, unspecified: Secondary | ICD-10-CM | POA: Insufficient documentation

## 2013-03-31 DIAGNOSIS — K222 Esophageal obstruction: Secondary | ICD-10-CM

## 2013-03-31 DIAGNOSIS — Q391 Atresia of esophagus with tracheo-esophageal fistula: Secondary | ICD-10-CM | POA: Insufficient documentation

## 2013-03-31 HISTORY — PX: ESOPHAGOGASTRODUODENOSCOPY (EGD) WITH ESOPHAGEAL DILATION: SHX5812

## 2013-03-31 HISTORY — PX: COLONOSCOPY: SHX5424

## 2013-03-31 LAB — GLUCOSE, CAPILLARY: Glucose-Capillary: 165 mg/dL — ABNORMAL HIGH (ref 70–99)

## 2013-03-31 SURGERY — COLONOSCOPY
Anesthesia: Moderate Sedation

## 2013-03-31 MED ORDER — BUTAMBEN-TETRACAINE-BENZOCAINE 2-2-14 % EX AERO
INHALATION_SPRAY | CUTANEOUS | Status: DC | PRN
  Administered 2013-03-31: 2 via TOPICAL

## 2013-03-31 MED ORDER — MEPERIDINE HCL 100 MG/ML IJ SOLN
INTRAMUSCULAR | Status: AC
  Filled 2013-03-31: qty 1

## 2013-03-31 MED ORDER — STERILE WATER FOR IRRIGATION IR SOLN
Status: DC | PRN
  Administered 2013-03-31: 10:00:00

## 2013-03-31 MED ORDER — MIDAZOLAM HCL 5 MG/5ML IJ SOLN
INTRAMUSCULAR | Status: DC | PRN
  Administered 2013-03-31 (×3): 1 mg via INTRAVENOUS

## 2013-03-31 MED ORDER — SODIUM CHLORIDE 0.9 % IV SOLN
INTRAVENOUS | Status: DC
  Administered 2013-03-31: 09:00:00 via INTRAVENOUS

## 2013-03-31 MED ORDER — ONDANSETRON HCL 4 MG/2ML IJ SOLN
INTRAMUSCULAR | Status: DC | PRN
  Administered 2013-03-31: 4 mg via INTRAVENOUS

## 2013-03-31 MED ORDER — MIDAZOLAM HCL 5 MG/5ML IJ SOLN
INTRAMUSCULAR | Status: AC
  Filled 2013-03-31: qty 10

## 2013-03-31 MED ORDER — ONDANSETRON HCL 4 MG/2ML IJ SOLN
INTRAMUSCULAR | Status: AC
  Filled 2013-03-31: qty 2

## 2013-03-31 MED ORDER — MEPERIDINE HCL 100 MG/ML IJ SOLN
INTRAMUSCULAR | Status: DC | PRN
  Administered 2013-03-31 (×2): 25 mg via INTRAVENOUS

## 2013-03-31 NOTE — Op Note (Signed)
[facility] [address] [place], [address]   ENDOSCOPY PROCEDURE REPORT  PATIENT: Ian Mccann, Ian Mccann  MR#: [id number] BIRTHDATE: 1928/10/27 , 83  yrs. old GENDER: Male ENDOSCOPIST: R.  Roetta Sessions, MD FACP FACG REFERRED BY:  Carylon Perches, M.D. PROCEDURE DATE:  03/31/2013 PROCEDURE:    EGD with Elease Hashimoto dilation followed by gastric biopsy  INDICATIONS:    pill dysphagia; history of Schatzki's ring  INFORMED CONSENT:   The risks, benefits, limitations, alternatives and imponderables have been discussed.  The potential for biopsy, esophogeal dilation, etc. have also been reviewed.  Questions have been answered.  All parties agreeable.  Please see the history and physical in the medical record for more information.  MEDICATIONS:   Versed 2 mg IV and Demerol 50 mg IV in divided doses. Zofran 4 mg IV. Cetacaine spray.  DESCRIPTION OF PROCEDURE:   The EG-2990i ([id number])  endoscope was introduced through the mouth and advanced to the second portion of the duodenum without difficulty or limitations.  The mucosal surfaces were surveyed very carefully during advancement of the scope and upon withdrawal.  Retroflexion view of the proximal stomach and esophagogastric junction was performed.      FINDINGS: Noncritical Schatzki's ring; otherwise, the esophageal mucosa appeared normal. Stomach empty. Small hiatal hernia. Diffusely somewhat polypoid mucosa. No ulcer or infiltrating process. Patent pylorus. Normal first and second portion of the duodenum.  THERAPEUTIC / DIAGNOSTIC MANEUVERS PERFORMED:  A 56 French Maloney dilator was passed to full insertion easily. Subsequently, a 29 French Maloney dilator is passed to full insertion easily. A look back revealed no apparent complication related to these maneuvers. Finally, biopsies abnormal gastric mucosa taken for histologic study.   COMPLICATIONS:  None  IMPRESSION:  Schatzki's ring-status post dilation as  described above. Hiatal hernia. Abnormal gastric mucosa of uncertain significance-status post biopsy  RECOMMENDATIONS:    Followup on pathology. See colonoscopy report.    _______________________________ R. Roetta Sessions, MD FACP [facility] eSigned:  R. Roetta Sessions, MD FACP [facility] 03/31/2013 10:20 AM     CC:

## 2013-03-31 NOTE — Op Note (Signed)
[facility] [address] [place], [address]   COLONOSCOPY PROCEDURE REPORT  PATIENT: Ian, Mccann  MR#:         [id number] BIRTHDATE: 1929-03-03 , 83  yrs. old GENDER: Male ENDOSCOPIST: R.  Roetta Sessions, MD FACP FACG REFERRED BY:  Carylon Perches, M.D. PROCEDURE DATE:  03/31/2013 PROCEDURE:     Colonoscopy with biopsy  INDICATIONS: History of colonic adenoma  INFORMED CONSENT:  The risks, benefits, alternatives and imponderables including but not limited to bleeding, perforation as well as the possibility of a missed lesion have been reviewed.  The potential for biopsy, lesion removal, etc. have also been discussed.  Questions have been answered.  All parties agreeable. Please see the history and physical in the medical record for more information.  MEDICATIONS: Versed 3 mg IV and Demerol 50 mg IV in divided doses. Zofran 4 mg IV. Cetacaine spray.  DESCRIPTION OF PROCEDURE:  After a digital rectal exam was performed, the EG-[id number] ([id number]) and EC-3890Li ([id number]) colonoscope was advanced from the anus through the rectum and colon to the area of the cecum, ileocecal valve and appendiceal orifice. The cecum was deeply intubated.  These structures were well-seen and photographed for the record.  From the level of the cecum and ileocecal valve, the scope was slowly and cautiously withdrawn. The mucosal surfaces were carefully surveyed utilizing scope tip deflection to facilitate fold flattening as needed.  The scope was pulled down into the rectum where a thorough examination including retroflexion was performed.    FINDINGS:  Neovascular changes in the anterior rectum consistent with radiation proctitis; otherwise, the rectal mucosa was unremarkable. Colonic mucosa appeared normal except for a single diminutive polyp at the ileocecal valve.  THERAPEUTIC / DIAGNOSTIC MANEUVERS PERFORMED:  The above-mentioned polyp was cold  biopsied/removed.  COMPLICATIONS: none  CECAL WITHDRAWAL TIME:  15 minutes  IMPRESSION:  Radiation proctitis. Colonic polyp-removed as described above  RECOMMENDATIONS:  Followup on pathology. See EGD report   _______________________________ eSigned:  R. Roetta Sessions, MD FACP [facility] 03/31/2013 10:44 AM   CC:

## 2013-03-31 NOTE — Interval H&P Note (Signed)
History and Physical Interval Note:  03/31/2013 9:53 AM  Ian Mccann  has presented today for surgery, with the diagnosis of COLON POLYPS, DYSPHAGIA  AND PANCREATIC CYST  The various methods of treatment have been discussed with the patient and family. After consideration of risks, benefits and other options for treatment, the patient has consented to  Procedure(s) with comments: COLONOSCOPY (N/A) - 9:45 ESOPHAGOGASTRODUODENOSCOPY (EGD) WITH ESOPHAGEAL DILATION (N/A) as a surgical intervention .  The patient's history has been reviewed, patient examined, no change in status, stable for surgery.  I have reviewed the patient's chart and labs.  Questions were answered to the patient's satisfaction.     Ian Mccann  No change. EGD with esophageal dilation as appropriate and surveillance colonoscopy per plan.  The risks, benefits, limitations, imponderables and alternatives regarding both EGD and colonoscopy have been reviewed with the patient. Questions have been answered. All parties agreeable.

## 2013-03-31 NOTE — H&P (View-Only) (Signed)
Primary Care Physician:  FAGAN,ROY, MD  Primary Gastroenterologist:  Michael Rourk, MD  Chief Complaint  Patient presents with  . Colonoscopy  . Dysphagia    HPI:  Ian Mccann is a 79 y.o. male here to schedule his surveillance colonoscopy and for some difficulty swallowing. His last colonoscopy was in 2009, he had a tubular adenoma removed. History of colon polyps on prior TCS in 2006 as well. He c/o difficulty swallowing larger pills. Denies problems with food at this point. Denies heartburn. He is on omeprazole 40mg daily. No constipation, diarrhea, melena, brbpr, n/v, abdominal pain. No weight loss.  Patient had CT A/P after diagnosis with prostate cancer in 08/2011. He had fatty replacement of portions of the pancreas and a 2 cm cyst arising superiorly from the pancreatic tail. F/u CT in 6 months recommended but has not been done.    Current Outpatient Prescriptions  Medication Sig Dispense Refill  . aspirin 81 MG tablet Take 81 mg by mouth daily.      . atorvastatin (LIPITOR) 20 MG tablet       . b complex vitamins tablet Take 1 tablet by mouth daily.      . Coenzyme Q10 (CO Q 10 PO) Take by mouth.      . dextromethorphan 15 MG/5ML syrup Take 10 mLs by mouth 4 (four) times daily as needed for cough.      . donepezil (ARICEPT) 10 MG tablet       . doxazosin (CARDURA) 4 MG tablet       . glimepiride (AMARYL) 1 MG tablet       . loratadine (CLARITIN) 10 MG tablet Take 10 mg by mouth daily.      . metFORMIN (GLUCOPHAGE) 500 MG tablet Take 500 mg by mouth 2 (two) times daily with a meal.      . Misc Natural Products (OSTEO BI-FLEX ADV TRIPLE ST) TABS Take by mouth.      . Multiple Vitamins-Minerals (CENTRUM SILVER PO) Take by mouth.      . omeprazole (PRILOSEC) 40 MG capsule       . VESICARE 5 MG tablet       . vitamin E 400 UNIT capsule Take 400 Units by mouth daily.       No current facility-administered medications for this visit.    Allergies as of 03/13/2013  . (No Known  Allergies)    Past Medical History  Diagnosis Date  . Prostate cancer 08/2011    XRT  . Diabetes mellitus   . Hyperlipidemia   . Kidney stones   . Rosacea   . GERD (gastroesophageal reflux disease)     Past Surgical History  Procedure Laterality Date  . Colonoscopy  2009    Dr. Rourk--> left-sided diverticulosis, splenic flexure polyp (tubular adenoma), cecal polyp.  . Esophagogastroduodenoscopy  2006    Dr. Rourk--> subtle submucosal ring status post dilation, varicose mucosa with focal erosions in the antrum  . Colonoscopy  2006    Dr. Rourk-->polyps  . Inguinal hernia repair      X 3    Family History  Problem Relation Age of Onset  . Colon cancer Neg Hx   . Cancer Brother     unknown   . Pancreatic cancer Brother     History   Social History  . Marital Status: Married    Spouse Name: N/A    Number of Children: 5  . Years of Education: N/A   Occupational History  .       Social History Main Topics  . Smoking status: Never Smoker   . Smokeless tobacco: Not on file  . Alcohol Use: No  . Drug Use: No  . Sexually Active: Not on file   Other Topics Concern  . Not on file   Social History Narrative  . No narrative on file      ROS:  General: Negative for anorexia, weight loss, fever, chills, fatigue, weakness. Eyes: Negative for vision changes.  ENT: Negative for hoarseness, nasal congestion. CV: Negative for chest pain, angina, palpitations, dyspnea on exertion, peripheral edema.  Respiratory: Negative for dyspnea at rest, dyspnea on exertion, cough, sputum, wheezing.  GI: See history of present illness. GU:  Negative for dysuria, hematuria, urinary incontinence, urinary frequency, nocturnal urination.  MS: Negative for joint pain, low back pain.  Derm: Negative for rash or itching.  Neuro: Negative for weakness, abnormal sensation, seizure, frequent headaches, memory loss, confusion.  Psych: Negative for anxiety, depression, suicidal ideation,  hallucinations.  Endo: Negative for unusual weight change.  Heme: Negative for bruising or bleeding. Allergy: Negative for rash or hives.    Physical Examination:  BP 129/72  Pulse 67  Temp(Src) 97 F (36.1 C) (Oral)  Ht 5' 10" (1.778 m)  Wt 222 lb (100.699 kg)  BMI 31.85 kg/m2   General: Well-nourished, well-developed in no acute distress. Accompanied by wife.  Head: Normocephalic, atraumatic.   Eyes: Conjunctiva pink, no icterus. Mouth: Oropharyngeal mucosa moist and pink , no lesions erythema or exudate. Neck: Supple without thyromegaly, masses, or lymphadenopathy.  Lungs: Clear to auscultation bilaterally.  Heart: Regular rate and rhythm, no murmurs rubs or gallops.  Abdomen: Bowel sounds are normal, nontender, nondistended, no hepatosplenomegaly or masses, no abdominal bruits or    hernia , no rebound or guarding.   Rectal: not performed Extremities: No lower extremity edema. No clubbing or deformities.  Neuro: Alert and oriented x 4 , grossly normal neurologically.  Skin: Warm and dry, no rash or jaundice.   Psych: Alert and cooperative, normal mood and affect.  Labs: 02/2013: WBC 5200, H/H 11.2/31.9, Platelet 174000, Cre 0.98, Tbili 0.5, AP 75, AST 18, ALT17, alb 3.7, HgbA1C 8.3.  Imaging Studies: No results found.    

## 2013-04-02 ENCOUNTER — Encounter (HOSPITAL_COMMUNITY): Payer: Self-pay | Admitting: Internal Medicine

## 2013-04-02 ENCOUNTER — Encounter: Payer: Self-pay | Admitting: Internal Medicine

## 2013-04-03 ENCOUNTER — Ambulatory Visit (HOSPITAL_COMMUNITY)
Admission: RE | Admit: 2013-04-03 | Discharge: 2013-04-03 | Disposition: A | Discharge status: Home / Self Care | Payer: Medicare Other | Source: Ambulatory Visit | Attending: Internal Medicine | Admitting: Internal Medicine

## 2013-04-03 DIAGNOSIS — K862 Cyst of pancreas: Secondary | ICD-10-CM

## 2013-04-03 DIAGNOSIS — Z8546 Personal history of malignant neoplasm of prostate: Secondary | ICD-10-CM | POA: Insufficient documentation

## 2013-04-03 DIAGNOSIS — Z09 Encounter for follow-up examination after completed treatment for conditions other than malignant neoplasm: Secondary | ICD-10-CM | POA: Insufficient documentation

## 2013-04-03 LAB — CBC
HCT: 32 %
HGB: 11.2 g/dL

## 2013-04-03 LAB — COMPREHENSIVE METABOLIC PANEL
ALT: 17 U/L (ref 10–40)
AST: 18 U/L
Albumin: 3.7

## 2013-04-03 MED ORDER — IOHEXOL 300 MG/ML  SOLN
100.0000 mL | Freq: Once | INTRAMUSCULAR | Status: AC | PRN
  Administered 2013-04-03: 100 mL via INTRAVENOUS

## 2013-04-04 LAB — POCT I-STAT, CHEM 8
Glucose, Bld: 131 mg/dL — ABNORMAL HIGH (ref 70–99)
HCT: 34 % — ABNORMAL LOW (ref 39.0–52.0)
Hemoglobin: 11.6 g/dL — ABNORMAL LOW (ref 13.0–17.0)
Potassium: 3.8 mEq/L (ref 3.5–5.1)
Sodium: 143 mEq/L (ref 135–145)
TCO2: 24 mmol/L (ref 0–100)

## 2013-04-17 ENCOUNTER — Emergency Department (HOSPITAL_COMMUNITY): Payer: Medicare Other

## 2013-04-17 ENCOUNTER — Other Ambulatory Visit: Payer: Self-pay

## 2013-04-17 ENCOUNTER — Emergency Department (HOSPITAL_COMMUNITY)
Admission: EM | Admit: 2013-04-17 | Discharge: 2013-04-17 | Disposition: A | Discharge status: Home / Self Care | Payer: Medicare Other | Attending: Emergency Medicine | Admitting: Emergency Medicine

## 2013-04-17 ENCOUNTER — Encounter (HOSPITAL_COMMUNITY): Payer: Self-pay | Admitting: Emergency Medicine

## 2013-04-17 DIAGNOSIS — H547 Unspecified visual loss: Secondary | ICD-10-CM | POA: Insufficient documentation

## 2013-04-17 DIAGNOSIS — Y9289 Other specified places as the place of occurrence of the external cause: Secondary | ICD-10-CM | POA: Insufficient documentation

## 2013-04-17 DIAGNOSIS — R197 Diarrhea, unspecified: Secondary | ICD-10-CM | POA: Insufficient documentation

## 2013-04-17 DIAGNOSIS — Z7982 Long term (current) use of aspirin: Secondary | ICD-10-CM | POA: Insufficient documentation

## 2013-04-17 DIAGNOSIS — E119 Type 2 diabetes mellitus without complications: Secondary | ICD-10-CM | POA: Insufficient documentation

## 2013-04-17 DIAGNOSIS — E785 Hyperlipidemia, unspecified: Secondary | ICD-10-CM | POA: Insufficient documentation

## 2013-04-17 DIAGNOSIS — E860 Dehydration: Secondary | ICD-10-CM | POA: Insufficient documentation

## 2013-04-17 DIAGNOSIS — X30XXXA Exposure to excessive natural heat, initial encounter: Secondary | ICD-10-CM | POA: Insufficient documentation

## 2013-04-17 DIAGNOSIS — T675XXA Heat exhaustion, unspecified, initial encounter: Secondary | ICD-10-CM | POA: Insufficient documentation

## 2013-04-17 DIAGNOSIS — Z87442 Personal history of urinary calculi: Secondary | ICD-10-CM | POA: Insufficient documentation

## 2013-04-17 DIAGNOSIS — H539 Unspecified visual disturbance: Secondary | ICD-10-CM | POA: Insufficient documentation

## 2013-04-17 DIAGNOSIS — F29 Unspecified psychosis not due to a substance or known physiological condition: Secondary | ICD-10-CM | POA: Insufficient documentation

## 2013-04-17 DIAGNOSIS — K219 Gastro-esophageal reflux disease without esophagitis: Secondary | ICD-10-CM | POA: Insufficient documentation

## 2013-04-17 DIAGNOSIS — Y9389 Activity, other specified: Secondary | ICD-10-CM | POA: Insufficient documentation

## 2013-04-17 DIAGNOSIS — Z8546 Personal history of malignant neoplasm of prostate: Secondary | ICD-10-CM | POA: Insufficient documentation

## 2013-04-17 DIAGNOSIS — R51 Headache: Secondary | ICD-10-CM | POA: Insufficient documentation

## 2013-04-17 DIAGNOSIS — R61 Generalized hyperhidrosis: Secondary | ICD-10-CM | POA: Insufficient documentation

## 2013-04-17 DIAGNOSIS — Z79899 Other long term (current) drug therapy: Secondary | ICD-10-CM | POA: Insufficient documentation

## 2013-04-17 LAB — COMPREHENSIVE METABOLIC PANEL
ALT: 15 U/L (ref 0–53)
AST: 17 U/L (ref 0–37)
Albumin: 3.2 g/dL — ABNORMAL LOW (ref 3.5–5.2)
Alkaline Phosphatase: 64 U/L (ref 39–117)
BUN: 15 mg/dL (ref 6–23)
Chloride: 104 mEq/L (ref 96–112)
Potassium: 4.2 mEq/L (ref 3.5–5.1)
Sodium: 140 mEq/L (ref 135–145)
Total Bilirubin: 0.3 mg/dL (ref 0.3–1.2)
Total Protein: 6.1 g/dL (ref 6.0–8.3)

## 2013-04-17 LAB — CBC WITH DIFFERENTIAL/PLATELET
Basophils Relative: 0 % (ref 0–1)
Hemoglobin: 11.5 g/dL — ABNORMAL LOW (ref 13.0–17.0)
MCHC: 34.5 g/dL (ref 30.0–36.0)
Monocytes Relative: 9 % (ref 3–12)
Neutro Abs: 6.7 10*3/uL (ref 1.7–7.7)
Neutrophils Relative %: 82 % — ABNORMAL HIGH (ref 43–77)
Platelets: 149 10*3/uL — ABNORMAL LOW (ref 150–400)
RBC: 3.84 MIL/uL — ABNORMAL LOW (ref 4.22–5.81)

## 2013-04-17 MED ORDER — SODIUM CHLORIDE 0.9 % IV BOLUS (SEPSIS)
1000.0000 mL | Freq: Once | INTRAVENOUS | Status: AC
  Administered 2013-04-17: 1000 mL via INTRAVENOUS

## 2013-04-17 NOTE — ED Provider Notes (Addendum)
CSN: [id number]     Arrival date & time 04/17/13  4098 History  This chart was scribed for Geoffery Lyons, MD by Bennett Scrape, ED Scribe. This patient was seen in room APA06/APA06 and the patient's care was started at 7:38 PM.    Chief Complaint  Patient presents with  . Fatigue  . Heat Exposure    The history is provided by the patient, the spouse and a relative. No language interpreter was used.    HPI Comments: Ian Mccann is a 79 y.o. male who presents to the Emergency Department complaining of one episode of AMS described as confusion, loss of vision and fatigue that occurred this afternoon. Pt states that he was riding a Surveyor, mining in the woods for 6 hours and felt diaphoretic. Granddaughter saw the pt sitting at a picnic table and asked his wife to go check on him. Wife states that the pt reported that he had lost his vision and appeared confused, unable to recognize family members. His clothes appeared "soaked". Pt also c/o diarrhea for the past 2 weeks and did not properly hydrate himself today. Wife admits that the pt has only consumed one half of a glass of Sprite. Per EMS, BS on the scene was 295, BP was 50/20. An IV of NS was started with an improvement of BP to 112/73. He reports a HA currently but states that he feels back to baseline. Wife reports one prior episode of the same when his DM medication was changed. She states that he was also prescribed potassium after being started on Lasix. He was diagnosed with prostate CA last year with one spot on his kidney and one of his liver. He had a CT scan of his abdomen on August 28th, 2014 but the results are unknown. He has been following up with Dr. Kendell Bane since the CA diagnosis last year. Family denies a h/o cardiac problems or prior cardiac surgery.   Past Medical History  Diagnosis Date  . Prostate cancer 08/2011    XRT  . Diabetes mellitus   . Hyperlipidemia   . Kidney stones   . Rosacea   . GERD (gastroesophageal reflux  disease)    Past Surgical History  Procedure Laterality Date  . Colonoscopy  2009    Dr. Rourk--> left-sided diverticulosis, splenic flexure polyp (tubular adenoma), cecal polyp.  . Esophagogastroduodenoscopy  2006    Dr. Rourk--> subtle submucosal ring status post dilation, varicose mucosa with focal erosions in the antrum  . Colonoscopy  2006    Dr. Marcell Anger  . Inguinal hernia repair      X 3  . Colonoscopy N/A 03/31/2013    Procedure: COLONOSCOPY;  Surgeon: Corbin Ade, MD;  Location: [facility];  Service: Endoscopy;  Laterality: N/A;  9:45  . Esophagogastroduodenoscopy (egd) with esophageal dilation N/A 03/31/2013    Procedure: ESOPHAGOGASTRODUODENOSCOPY (EGD) WITH ESOPHAGEAL DILATION;  Surgeon: Corbin Ade, MD;  Location: [facility];  Service: Endoscopy;  Laterality: N/A;   Family History  Problem Relation Age of Onset  . Colon cancer Neg Hx   . Cancer Brother     unknown   . Pancreatic cancer Brother    History  Substance Use Topics  . Smoking status: Never Smoker   . Smokeless tobacco: Not on file  . Alcohol Use: No    Review of Systems  Constitutional: Positive for diaphoresis.  Eyes: Positive for visual disturbance.  Gastrointestinal: Positive for diarrhea. Negative for vomiting.  Neurological: Positive  for headaches. Negative for dizziness and light-headedness.  Psychiatric/Behavioral: Positive for confusion.  All other systems reviewed and are negative.    Allergies  Review of patient's allergies indicates no known allergies.  Home Medications   Current Outpatient Rx  Name  Route  Sig  Dispense  Refill  . aspirin 81 MG tablet   Oral   Take 81 mg by mouth daily.         Marland Kitchen atorvastatin (LIPITOR) 20 MG tablet   Oral   Take 20 mg by mouth daily.          Marland Kitchen b complex vitamins tablet   Oral   Take 1 tablet by mouth daily.         . Coenzyme Q10 (CO Q 10 PO)   Oral   Take 100 mg by mouth at bedtime.          . donepezil  (ARICEPT) 10 MG tablet   Oral   Take 10 mg by mouth at bedtime.          Marland Kitchen doxazosin (CARDURA) 4 MG tablet   Oral   Take 4 mg by mouth at bedtime.          Marland Kitchen glimepiride (AMARYL) 1 MG tablet   Oral   Take 1 mg by mouth daily after breakfast.          . lisinopril (PRINIVIL,ZESTRIL) 5 MG tablet   Oral   Take 5 mg by mouth daily.         Marland Kitchen loratadine (CLARITIN) 10 MG tablet   Oral   Take 10 mg by mouth daily.         . metFORMIN (GLUCOPHAGE) 500 MG tablet   Oral   Take 1,000 mg by mouth 2 (two) times daily with a meal.          . Misc Natural Products (OSTEO BI-FLEX ADV TRIPLE ST) TABS   Oral   Take 1 tablet by mouth 2 (two) times daily.          . Multiple Vitamins-Minerals (CENTRUM SILVER PO)   Oral   Take 1 tablet by mouth daily.          . naproxen sodium (ALEVE) 220 MG tablet   Oral   Take 220 mg by mouth 2 (two) times daily as needed (for backache).         Marland Kitchen omeprazole (PRILOSEC) 40 MG capsule   Oral   Take 40 mg by mouth daily.          . VESICARE 5 MG tablet   Oral   Take 5 mg by mouth daily.          . vitamin E 400 UNIT capsule   Oral   Take 400 Units by mouth daily.         Marland Kitchen guaiFENesin-dextromethorphan (ROBITUSSIN DM) 100-10 MG/5ML syrup   Oral   Take 10 mLs by mouth at bedtime.         . polyethylene glycol-electrolytes (TRILYTE) 420 G solution   Oral   Take 4,000 mLs by mouth as directed.   4000 mL   0    Triage Vitals: BP 121/57  Temp(Src) 97.7 F (36.5 C) (Oral)  Ht 5\' 10"  (1.778 m)  Wt 220 lb (99.791 kg)  BMI 31.57 kg/m2  SpO2 100%  Physical Exam  Nursing note and vitals reviewed. Constitutional: He is oriented to person, place, and time. He appears well-developed and well-nourished. No distress.  He  appears somewhat pale  HENT:  Head: Normocephalic and atraumatic.  Eyes: Conjunctivae and EOM are normal.  Neck: Normal range of motion. Neck supple. No tracheal deviation present.  Cardiovascular:  Normal rate, regular rhythm and normal heart sounds.   No murmur heard. Pulmonary/Chest: Effort normal and breath sounds normal. No respiratory distress. He has no wheezes. He has no rales.  Abdominal: Soft. Bowel sounds are normal. There is no tenderness.  Musculoskeletal: Normal range of motion. He exhibits no edema.  Neurological: He is alert and oriented to person, place, and time. No cranial nerve deficit. He exhibits normal muscle tone. Coordination normal.  Skin: Skin is warm and dry. There is pallor.  Psychiatric: He has a normal mood and affect. His behavior is normal.    ED Course  Procedures (including critical care time)  DIAGNOSTIC STUDIES: Oxygen Saturation is 100% on room air, normal by my interpretation.    COORDINATION OF CARE: 7:48 PM-Discussed treatment plan with pt at bedside and pt agreed to plan.   Labs Review Labs Reviewed - No data to display Imaging Review No results found.   Date: 04/17/2013  Rate: 81  Rhythm: normal sinus rhythm  QRS Axis: normal  Intervals: normal  ST/T Wave abnormalities: normal  Conduction Disutrbances:none  Narrative Interpretation:   Old EKG Reviewed: unchanged    MDM  No diagnosis found. This patient presents after an episode of weakness and dizziness that occurred while he was running a leaf blower outside in the hot sun. He became weak and fatigued and had to sit down to rest. He seemed disoriented when his wife found him and he stated that he was having difficulty seeing. Workup today revealed unremarkable laboratory studies with the exception of an elevated glucose of 256. CT scan of the head was negative for intracranial pathology. He was hydrated with 1 L of normal saline and is feeling much better. He is requesting to go home. He remains neurologically intact and I suspect his symptoms are related to heat exhaustion. He has no neurologic deficits and I doubt this represents an acute stroke. He is to return should his  symptoms worsen or change. .  I personally performed the services described in this documentation, which was scribed in my presence. The recorded information has been reviewed and is accurate.      Geoffery Lyons, MD 04/17/13 2157  Geoffery Lyons, MD 04/17/13 2201

## 2013-04-17 NOTE — ED Notes (Signed)
Pt arrives EMS, working outside 6 hrs today and got to hot. BP 50/20, IV started, 250  Cc Ns given. Recheck BP 112/73. Pt feels better, continues to feel weak.

## 2013-04-17 NOTE — ED Notes (Signed)
BS on scene 295, wife at the bedside. Pt states he has diarrhea for the past 2 weeks.

## 2013-04-17 NOTE — ED Notes (Signed)
Pt alert & oriented x4, stable gait. Patient given discharge instructions, paperwork & prescription(s). Patient  instructed to stop at the registration desk to finish any additional paperwork. Patient verbalized understanding. Pt left department w/ no further questions. 

## 2013-04-22 ENCOUNTER — Telehealth: Payer: Self-pay | Admitting: *Deleted

## 2013-04-22 NOTE — Telephone Encounter (Signed)
Pt's wife is aware of the results

## 2013-04-22 NOTE — Telephone Encounter (Signed)
Pt's wife called stating pt had colonoscopy done aug. 25. Pt was wanting results of CT, pt's wife states CT was done aug. 28. Please advise [phone]

## 2013-05-06 ENCOUNTER — Ambulatory Visit (INDEPENDENT_AMBULATORY_CARE_PROVIDER_SITE_OTHER): Payer: Medicare Other | Admitting: Urology

## 2013-05-06 DIAGNOSIS — C61 Malignant neoplasm of prostate: Secondary | ICD-10-CM

## 2013-05-06 DIAGNOSIS — N3941 Urge incontinence: Secondary | ICD-10-CM

## 2013-09-09 ENCOUNTER — Ambulatory Visit (INDEPENDENT_AMBULATORY_CARE_PROVIDER_SITE_OTHER): Payer: Medicare Other | Admitting: Urology

## 2013-09-09 DIAGNOSIS — C61 Malignant neoplasm of prostate: Secondary | ICD-10-CM

## 2013-10-10 ENCOUNTER — Other Ambulatory Visit: Payer: Self-pay | Admitting: Orthopedic Surgery

## 2013-10-17 ENCOUNTER — Encounter (HOSPITAL_BASED_OUTPATIENT_CLINIC_OR_DEPARTMENT_OTHER): Payer: Self-pay | Admitting: *Deleted

## 2013-10-17 NOTE — Progress Notes (Signed)
Will need istat-hx prostate cancer-no cardiac or resp problems-HOH

## 2013-10-22 ENCOUNTER — Encounter (HOSPITAL_BASED_OUTPATIENT_CLINIC_OR_DEPARTMENT_OTHER)
Admission: RE | Disposition: A | Discharge status: Home / Self Care | Payer: Self-pay | Source: Ambulatory Visit | Attending: Orthopedic Surgery

## 2013-10-22 ENCOUNTER — Ambulatory Visit (HOSPITAL_BASED_OUTPATIENT_CLINIC_OR_DEPARTMENT_OTHER)
Admission: RE | Admit: 2013-10-22 | Discharge: 2013-10-22 | Disposition: A | Discharge status: Home / Self Care | Payer: Medicare Other | Source: Ambulatory Visit | Attending: Orthopedic Surgery | Admitting: Orthopedic Surgery

## 2013-10-22 ENCOUNTER — Encounter (HOSPITAL_BASED_OUTPATIENT_CLINIC_OR_DEPARTMENT_OTHER): Payer: Self-pay | Admitting: Orthopedic Surgery

## 2013-10-22 ENCOUNTER — Encounter (HOSPITAL_BASED_OUTPATIENT_CLINIC_OR_DEPARTMENT_OTHER): Payer: Medicare Other | Admitting: Certified Registered"

## 2013-10-22 ENCOUNTER — Ambulatory Visit (HOSPITAL_BASED_OUTPATIENT_CLINIC_OR_DEPARTMENT_OTHER): Payer: Medicare Other | Admitting: Certified Registered"

## 2013-10-22 DIAGNOSIS — I10 Essential (primary) hypertension: Secondary | ICD-10-CM | POA: Insufficient documentation

## 2013-10-22 DIAGNOSIS — M65849 Other synovitis and tenosynovitis, unspecified hand: Principal | ICD-10-CM

## 2013-10-22 DIAGNOSIS — M129 Arthropathy, unspecified: Secondary | ICD-10-CM | POA: Insufficient documentation

## 2013-10-22 DIAGNOSIS — E785 Hyperlipidemia, unspecified: Secondary | ICD-10-CM | POA: Insufficient documentation

## 2013-10-22 DIAGNOSIS — H269 Unspecified cataract: Secondary | ICD-10-CM | POA: Insufficient documentation

## 2013-10-22 DIAGNOSIS — M65839 Other synovitis and tenosynovitis, unspecified forearm: Secondary | ICD-10-CM | POA: Insufficient documentation

## 2013-10-22 DIAGNOSIS — C61 Malignant neoplasm of prostate: Secondary | ICD-10-CM | POA: Insufficient documentation

## 2013-10-22 DIAGNOSIS — H9190 Unspecified hearing loss, unspecified ear: Secondary | ICD-10-CM | POA: Insufficient documentation

## 2013-10-22 DIAGNOSIS — K219 Gastro-esophageal reflux disease without esophagitis: Secondary | ICD-10-CM | POA: Insufficient documentation

## 2013-10-22 DIAGNOSIS — E119 Type 2 diabetes mellitus without complications: Secondary | ICD-10-CM | POA: Insufficient documentation

## 2013-10-22 HISTORY — PX: TRIGGER FINGER RELEASE: SHX641

## 2013-10-22 HISTORY — DX: Unspecified osteoarthritis, unspecified site: M19.90

## 2013-10-22 HISTORY — DX: Presence of dental prosthetic device (complete) (partial): Z97.2

## 2013-10-22 HISTORY — DX: Unspecified hearing loss, unspecified ear: H91.90

## 2013-10-22 HISTORY — DX: Presence of spectacles and contact lenses: Z97.3

## 2013-10-22 HISTORY — DX: Essential (primary) hypertension: I10

## 2013-10-22 HISTORY — DX: Complete loss of teeth, unspecified cause, unspecified class: K08.109

## 2013-10-22 HISTORY — DX: Presence of external hearing-aid: Z97.4

## 2013-10-22 LAB — POCT I-STAT, CHEM 8
BUN: 24 mg/dL — ABNORMAL HIGH (ref 6–23)
CALCIUM ION: 1.12 mmol/L — AB (ref 1.13–1.30)
Chloride: 105 mEq/L (ref 96–112)
Creatinine, Ser: 1 mg/dL (ref 0.50–1.35)
Glucose, Bld: 159 mg/dL — ABNORMAL HIGH (ref 70–99)
HCT: 34 % — ABNORMAL LOW (ref 39.0–52.0)
HEMOGLOBIN: 11.6 g/dL — AB (ref 13.0–17.0)
Potassium: 5 mEq/L (ref 3.7–5.3)
SODIUM: 141 meq/L (ref 137–147)
TCO2: 25 mmol/L (ref 0–100)

## 2013-10-22 LAB — GLUCOSE, CAPILLARY: Glucose-Capillary: 132 mg/dL — ABNORMAL HIGH (ref 70–99)

## 2013-10-22 SURGERY — RELEASE, A1 PULLEY, FOR TRIGGER FINGER
Anesthesia: Regional | Site: Finger | Laterality: Right

## 2013-10-22 MED ORDER — HYDROMORPHONE HCL PF 1 MG/ML IJ SOLN: 0.2500 mg | INTRAMUSCULAR | Status: DC | PRN

## 2013-10-22 MED ORDER — FENTANYL CITRATE 0.05 MG/ML IJ SOLN
INTRAMUSCULAR | Status: AC
  Filled 2013-10-22: qty 5

## 2013-10-22 MED ORDER — FENTANYL CITRATE 0.05 MG/ML IJ SOLN: 50.0000 ug | INTRAMUSCULAR | Status: DC | PRN

## 2013-10-22 MED ORDER — BUPIVACAINE HCL (PF) 0.25 % IJ SOLN
INTRAMUSCULAR | Status: DC | PRN
  Administered 2013-10-22: 6 mL

## 2013-10-22 MED ORDER — LIDOCAINE HCL (CARDIAC) 20 MG/ML IV SOLN
INTRAVENOUS | Status: DC | PRN
  Administered 2013-10-22: 30 mg via INTRAVENOUS

## 2013-10-22 MED ORDER — CHLORHEXIDINE GLUCONATE 4 % EX LIQD: 60.0000 mL | Freq: Once | CUTANEOUS | Status: DC

## 2013-10-22 MED ORDER — MEPERIDINE HCL 25 MG/ML IJ SOLN: 6.2500 mg | INTRAMUSCULAR | Status: DC | PRN

## 2013-10-22 MED ORDER — MIDAZOLAM HCL 2 MG/2ML IJ SOLN: 1.0000 mg | INTRAMUSCULAR | Status: DC | PRN

## 2013-10-22 MED ORDER — CEFAZOLIN SODIUM-DEXTROSE 2-3 GM-% IV SOLR
2.0000 g | INTRAVENOUS | Status: AC
  Administered 2013-10-22: 2 g via INTRAVENOUS

## 2013-10-22 MED ORDER — ONDANSETRON HCL 4 MG/2ML IJ SOLN
INTRAMUSCULAR | Status: DC | PRN
  Administered 2013-10-22: 4 mg via INTRAVENOUS

## 2013-10-22 MED ORDER — LACTATED RINGERS IV SOLN
INTRAVENOUS | Status: DC
  Administered 2013-10-22: 09:00:00 via INTRAVENOUS

## 2013-10-22 MED ORDER — OXYCODONE HCL 5 MG/5ML PO SOLN: 5.0000 mg | Freq: Once | ORAL | Status: DC | PRN

## 2013-10-22 MED ORDER — ONDANSETRON HCL 4 MG/2ML IJ SOLN: 4.0000 mg | Freq: Once | INTRAMUSCULAR | Status: DC | PRN

## 2013-10-22 MED ORDER — OXYCODONE HCL 5 MG PO TABS: 5.0000 mg | ORAL_TABLET | Freq: Once | ORAL | Status: DC | PRN

## 2013-10-22 MED ORDER — HYDROCODONE-ACETAMINOPHEN 5-325 MG PO TABS: 1.0000 | ORAL_TABLET | Freq: Four times a day (QID) | ORAL | Status: DC | PRN

## 2013-10-22 MED ORDER — FENTANYL CITRATE 0.05 MG/ML IJ SOLN
INTRAMUSCULAR | Status: AC
  Filled 2013-10-22: qty 6

## 2013-10-22 MED ORDER — FENTANYL CITRATE 0.05 MG/ML IJ SOLN
INTRAMUSCULAR | Status: DC | PRN
  Administered 2013-10-22: 25 ug via INTRAVENOUS

## 2013-10-22 MED ORDER — CEFAZOLIN SODIUM-DEXTROSE 2-3 GM-% IV SOLR: 2.0000 g | INTRAVENOUS | Status: DC

## 2013-10-22 MED ORDER — LIDOCAINE HCL (PF) 0.5 % IJ SOLN
INTRAMUSCULAR | Status: DC | PRN
  Administered 2013-10-22: 30 mL via INTRAVENOUS

## 2013-10-22 MED ORDER — PROPOFOL INFUSION 10 MG/ML OPTIME
INTRAVENOUS | Status: DC | PRN
Start: 2013-10-22 — End: 2013-10-22
  Administered 2013-10-22: 75 ug/kg/min via INTRAVENOUS

## 2013-10-22 SURGICAL SUPPLY — 35 items
BANDAGE COBAN STERILE 2 (GAUZE/BANDAGES/DRESSINGS) ×3 IMPLANT
BLADE SURG 15 STRL LF DISP TIS (BLADE) ×1 IMPLANT
BLADE SURG 15 STRL SS (BLADE) ×2
BNDG CMPR 9X4 STRL LF SNTH (GAUZE/BANDAGES/DRESSINGS) ×1
BNDG ESMARK 4X9 LF (GAUZE/BANDAGES/DRESSINGS) ×3 IMPLANT
CHLORAPREP W/TINT 26ML (MISCELLANEOUS) ×3 IMPLANT
CORDS BIPOLAR (ELECTRODE) ×3 IMPLANT
COVER MAYO STAND STRL (DRAPES) ×3 IMPLANT
COVER TABLE BACK 60X90 (DRAPES) ×3 IMPLANT
CUFF TOURNIQUET SINGLE 18IN (TOURNIQUET CUFF) ×3 IMPLANT
DECANTER SPIKE VIAL GLASS SM (MISCELLANEOUS) IMPLANT
DRAPE EXTREMITY T 121X128X90 (DRAPE) ×3 IMPLANT
DRAPE SURG 17X23 STRL (DRAPES) ×3 IMPLANT
GAUZE XEROFORM 1X8 LF (GAUZE/BANDAGES/DRESSINGS) ×3 IMPLANT
GLOVE BIOGEL PI IND STRL 7.0 (GLOVE) ×1 IMPLANT
GLOVE BIOGEL PI IND STRL 8.5 (GLOVE) ×1 IMPLANT
GLOVE BIOGEL PI INDICATOR 7.0 (GLOVE) ×2
GLOVE BIOGEL PI INDICATOR 8.5 (GLOVE) ×2
GLOVE ECLIPSE 6.5 STRL STRAW (GLOVE) ×3 IMPLANT
GLOVE SURG ORTHO 8.0 STRL STRW (GLOVE) ×3 IMPLANT
GOWN STRL REUS W/ TWL LRG LVL3 (GOWN DISPOSABLE) ×1 IMPLANT
GOWN STRL REUS W/TWL LRG LVL3 (GOWN DISPOSABLE) ×3
GOWN STRL REUS W/TWL XL LVL3 (GOWN DISPOSABLE) ×3 IMPLANT
NEEDLE 27GAX1X1/2 (NEEDLE) ×3 IMPLANT
NS IRRIG 1000ML POUR BTL (IV SOLUTION) ×3 IMPLANT
PACK BASIN DAY SURGERY FS (CUSTOM PROCEDURE TRAY) ×3 IMPLANT
PADDING CAST ABS 4INX4YD NS (CAST SUPPLIES)
PADDING CAST ABS COTTON 4X4 ST (CAST SUPPLIES) IMPLANT
SPONGE GAUZE 4X4 12PLY (GAUZE/BANDAGES/DRESSINGS) ×3 IMPLANT
STOCKINETTE 4X48 STRL (DRAPES) ×3 IMPLANT
SUT VICRYL RAPIDE 4/0 PS 2 (SUTURE) ×3 IMPLANT
SYR BULB 3OZ (MISCELLANEOUS) ×3 IMPLANT
SYR CONTROL 10ML LL (SYRINGE) ×3 IMPLANT
TOWEL OR 17X24 6PK STRL BLUE (TOWEL DISPOSABLE) ×3 IMPLANT
UNDERPAD 30X30 INCONTINENT (UNDERPADS AND DIAPERS) ×3 IMPLANT

## 2013-10-22 NOTE — Op Note (Signed)
Dictation Number [id number]

## 2013-10-22 NOTE — Brief Op Note (Signed)
10/22/2013  10:05 AM  PATIENT:  Ian Mccann  80 y.o. male  PRE-OPERATIVE DIAGNOSIS:  STENOSING TENOSYNOVITIS RIGHT MIDDLE FINGER  POST-OPERATIVE DIAGNOSIS:  STENOSING TENOSYNOVITIS RIGHT MIDDLE FINGER  PROCEDURE:  Procedure(s) with comments: RELEASE RIGHT MIDDLE FINGER TRIGGER A-1 PULLEY (Right) - ANESTHESIA: IV REGIONAL/FAB  SURGEON:  Surgeon(s) and Role:    * Wynonia Sours, MD - Primary  PHYSICIAN ASSISTANT:   ASSISTANTS: none   ANESTHESIA:   local and regional  EBL:  Total I/O In: 700 [I.V.:700] Out: -   BLOOD ADMINISTERED:none  DRAINS: none   LOCAL MEDICATIONS USED:  BUPIVICAINE   SPECIMEN:  No Specimen  DISPOSITION OF SPECIMEN:  N/A  COUNTS:  YES  TOURNIQUET:   Total Tourniquet Time Documented: Forearm (Right) - 14 minutes Total: Forearm (Right) - 14 minutes   DICTATION: .Other Dictation: Dictation Number [id number]  PLAN OF CARE: Discharge to home after PACU  PATIENT DISPOSITION:  PACU - hemodynamically stable.

## 2013-10-22 NOTE — Anesthesia Procedure Notes (Addendum)
Procedure Name: MAC Date/Time: 10/22/2013 9:40 AM Performed by: Verma Grothaus Pre-anesthesia Checklist: Patient identified, Emergency Drugs available, Suction available, Patient being monitored and Timeout performed Patient Re-evaluated:Patient Re-evaluated prior to inductionOxygen Delivery Method: Simple face mask   Anesthesia Regional Block:  Bier block (IV Regional)  Pre-Anesthetic Checklist: ,, timeout performed, Correct Patient, Correct Site, Correct Laterality, Correct Procedure,, site marked, surgical consent,, at surgeon's request Needles:  Injection technique: Single-shot  Needle Type: Other      Needle Gauge: 20 and 20 G    Additional Needles: Bier block (IV Regional) Narrative:   Performed by: Personally

## 2013-10-22 NOTE — Anesthesia Preprocedure Evaluation (Signed)
Anesthesia Evaluation  Patient identified by MRN, date of birth, ID band Patient awake    Reviewed: Allergy & Precautions, H&P , NPO status , Patient's Chart, lab work & pertinent test results  Airway       Dental   Pulmonary          Cardiovascular hypertension, Pt. on medications     Neuro/Psych    GI/Hepatic GERD-  Medicated and Controlled,  Endo/Other  diabetes, Type 2  Renal/GU      Musculoskeletal   Abdominal   Peds  Hematology   Anesthesia Other Findings   Reproductive/Obstetrics                           Anesthesia Physical Anesthesia Plan  ASA: II  Anesthesia Plan: Bier Block   Post-op Pain Management:    Induction: Intravenous  Airway Management Planned:   Additional Equipment:   Intra-op Plan:   Post-operative Plan:   Informed Consent: I have reviewed the patients History and Physical, chart, labs and discussed the procedure including the risks, benefits and alternatives for the proposed anesthesia with the patient or authorized representative who has indicated his/her understanding and acceptance.     Plan Discussed with: CRNA and Surgeon  Anesthesia Plan Comments:         Anesthesia Quick Evaluation

## 2013-10-22 NOTE — Op Note (Signed)
NAME:  RAYQUAN, AMRHEIN                  ACCOUNT NO.:  [id number]  MEDICAL RECORD NO.:  [id number]  LOCATION:                                 FACILITY:  PHYSICIAN:  [name], M.D.            DATE OF BIRTH:  DATE OF PROCEDURE:  10/22/2013 DATE OF DISCHARGE:                              OPERATIVE REPORT   PREOPERATIVE DIAGNOSIS:  Stenosing tenosynovitis, right middle finger.  POSTOPERATIVE DIAGNOSIS:  Stenosing tenosynovitis, right middle finger.  OPERATION:  Release A1 pulley, right middle finger.  SURGEON:  [name], MD  ANESTHESIA:  Forearm based IV regional with local infiltration.  ANESTHESIOLOGIST:  Crissie Sickles. Conrad [place], MD  HISTORY:  The patient is an 80 year old male with a history of triggering of his right middle finger.  This has not responded to conservative treatment.  He has elected to undergo surgical release. Pre, peri, and postoperative course have been discussed along with risks and complications.  He is aware there is no guarantee with the surgery, possibility of infection, recurrence of injury to arteries, nerves, tendons, incomplete relief of symptoms, and dystrophy.  In preoperative area, the patient is seen, the extremity marked by both patient and surgeon.  Antibiotic given.  PROCEDURE IN DETAIL:  The patient was brought to the operating room, where a forearm-based IV regional anesthetic was carried out without difficulty.  He was prepped using ChloraPrep, supine position with the right arm free.  A 3-minute dry time was allowed.  Time-out taken, confirming the patient and procedure.  An oblique incision was made over the A1 pulley of the right middle finger and carried down through subcutaneous tissue.  Bleeders were electrocauterized with bipolar.  The dissection carried down to the A1 pulley.  This was released on its radial aspect after placing retractors and protecting neurovascular bundles.  A small incision was made centrally in A2.  A  significant adherence between the flexor tendons was noted.  This was released with removing proximal tenosynovitis.  The wound was irrigated.  The finger placed through a full range of motion, no further triggering was noted. The wound was closed with interrupted 4-0 Vicryl Rapide sutures.  Local infiltration with 0.25% Marcaine without epinephrine was given, approximately 5 mL used.  A sterile compressive dressing with the fingers free was applied.  On deflation of the tourniquet, all fingers immediately pinked.  He was taken to the recovery room for observation in satisfactory condition.          ______________________________ [name], M.D.     GK/MEDQ  D:  10/22/2013  T:  10/22/2013  Job:  [id number]

## 2013-10-22 NOTE — Transfer of Care (Signed)
Immediate Anesthesia Transfer of Care Note  Patient: Ian Mccann  Procedure(s) Performed: Procedure(s) with comments: RELEASE RIGHT MIDDLE FINGER TRIGGER A-1 PULLEY (Right) - ANESTHESIA: IV REGIONAL/FAB  Patient Location: PACU  Anesthesia Type:Bier block  Level of Consciousness: awake, alert , oriented and patient cooperative  Airway & Oxygen Therapy: Patient Spontanous Breathing and Patient connected to face mask oxygen  Post-op Assessment: Report given to PACU RN and Post -op Vital signs reviewed and stable  Post vital signs: Reviewed and stable  Complications: No apparent anesthesia complications

## 2013-10-22 NOTE — Anesthesia Postprocedure Evaluation (Signed)
Anesthesia Post Note  Patient: Ian Mccann  Procedure(s) Performed: Procedure(s) (LRB): RELEASE RIGHT MIDDLE FINGER TRIGGER A-1 PULLEY (Right)  Anesthesia type: general  Patient location: PACU  Post pain: Pain level controlled  Post assessment: Patient's Cardiovascular Status Stable  Last Vitals:  Filed Vitals:   10/22/13 1056  BP: 152/68  Pulse: 63  Temp: 36.8 C  Resp: 16    Post vital signs: Reviewed and stable  Level of consciousness: sedated  Complications: No apparent anesthesia complications

## 2013-10-22 NOTE — H&P (Signed)
Ian Mccann is an 80 year old right hand dominant male with catching of his right middle finger. This has been going on for greater than a year. He is having some mild discomfort with it. He recalls no history of injury. He does have a history of diabetes and arthritis. He has no history of gout. He does not complain of other fingers catching. He complains of intermittent severe aching throbbing pain and inability to straighten this. He says driving particularly causes problems and will have to take his opposite hand to get them straight. Rest has helped. Activity makes it worse. It occasionally awakens him at night when it gets caught down.   PAST MEDICAL HISTORY: He has no known drug allergies. He is on Metformin, Glipizide, Lisinopril, potassium, Vesicare, Doxazosin, baby aspirin, and a statin. He has had 3 groin hernias and varicocele removal.   FAMILY H ISTORY: Positive for high BP and arthritis.  SOCIAL HISTORY: He does not smoke or drink. He is married and retired.   REVIEW OF SYSTEMS: Positive for prostate cancer, cataracts, glasses, shortness of breath, sleep disorder, and hearing loss. Ian Mccann is an 80 y.o. male.   Chief Complaint: sts rmf HPI: see above  Past Medical History  Diagnosis Date  . Prostate cancer 08/2011    XRT  . Diabetes mellitus   . Hyperlipidemia   . Kidney stones   . Rosacea   . GERD (gastroesophageal reflux disease)   . Hypertension   . Arthritis   . Wears hearing aid   . HOH (hard of hearing)   . Full dentures   . Wears glasses     reading    Past Surgical History  Procedure Laterality Date  . Colonoscopy  2009    Dr. Rourk--> left-sided diverticulosis, splenic flexure polyp (tubular adenoma), cecal polyp.  . Esophagogastroduodenoscopy  2006    Dr. Rourk--> subtle submucosal ring status post dilation, varicose mucosa with focal erosions in the antrum  . Colonoscopy  2006    Dr. Guerry Minors  . Colonoscopy N/A 03/31/2013    Procedure:  COLONOSCOPY;  Surgeon: Daneil Dolin, MD;  Location: [facility];  Service: Endoscopy;  Laterality: N/A;  9:45  . Esophagogastroduodenoscopy (egd) with esophageal dilation N/A 03/31/2013    Procedure: ESOPHAGOGASTRODUODENOSCOPY (EGD) WITH ESOPHAGEAL DILATION;  Surgeon: Daneil Dolin, MD;  Location: [facility];  Service: Endoscopy;  Laterality: N/A;  . Inguinal hernia repair      2 on rt-1 on lt  . Appendectomy    . Eye surgery      both cataracts    Family History  Problem Relation Age of Onset  . Colon cancer Neg Hx   . Cancer Brother     unknown   . Pancreatic cancer Brother    Social History:  reports that he has never smoked. He does not have any smokeless tobacco history on file. He reports that he does not drink alcohol or use illicit drugs.  Allergies: No Known Allergies  No prescriptions prior to admission    No results found for this or any previous visit (from the past 48 hour(s)).  No results found.   Pertinent items are noted in HPI.  Height 5\' 10"  (1.778 m), weight 230 lb (104.327 kg).  General appearance: alert, cooperative and appears stated age Head: Normocephalic, without obvious abnormality Neck: no JVD Resp: clear to auscultation bilaterally Cardio: regular rate and rhythm, S1, S2 normal, no murmur, click, rub or gallop GI: soft, non-tender;  bowel sounds normal; no masses,  no organomegaly Extremities: extremities normal, atraumatic, no cyanosis or edema Pulses: 2+ and symmetric Skin: Skin color, texture, turgor normal. No rashes or lesions Neurologic: Grossly normal Incision/Wound: na  Assessment/Plan Diagnosis: STS right middle finger.  I discussed with him and his wife the etiology of this. Alternatives are given including injection, possibility of alteration of his blood sugars or surgical release. The pre, peri and post op course are discussed along with risks and complications. They have elected to proceed with surgical intervention in  that he has difficulty controlling his blood sugars. He is scheduled as an outpatient for release of A-1 pulley right middle finger. He is aware there is no guarantee with surgery, possibility of infection, recurrence, injury to arteries, nerves and tendons, incomplete relief of symptoms and dystrophy.  This will be scheduled as an outpatient under regional anesthesia.  Danyale Ridinger R 10/22/2013, 7:45 AM

## 2013-10-22 NOTE — Discharge Instructions (Addendum)

## 2013-10-23 ENCOUNTER — Encounter (HOSPITAL_BASED_OUTPATIENT_CLINIC_OR_DEPARTMENT_OTHER): Payer: Self-pay | Admitting: Orthopedic Surgery

## 2013-10-24 NOTE — Addendum Note (Signed)
Addendum created 10/24/13 6945 by Tawni Millers, CRNA   Modules edited: Anesthesia Responsible Staff

## 2014-03-20 ENCOUNTER — Ambulatory Visit (HOSPITAL_COMMUNITY): Payer: Medicare Other | Admitting: Physical Therapy

## 2014-04-02 ENCOUNTER — Ambulatory Visit (HOSPITAL_COMMUNITY)
Admission: RE | Admit: 2014-04-02 | Discharge: 2014-04-02 | Disposition: A | Discharge status: Home / Self Care | Payer: Medicare Other | Source: Ambulatory Visit | Attending: Internal Medicine | Admitting: Internal Medicine

## 2014-04-02 DIAGNOSIS — M6281 Muscle weakness (generalized): Secondary | ICD-10-CM | POA: Diagnosis not present

## 2014-04-02 DIAGNOSIS — M25659 Stiffness of unspecified hip, not elsewhere classified: Secondary | ICD-10-CM | POA: Insufficient documentation

## 2014-04-02 DIAGNOSIS — M545 Low back pain, unspecified: Secondary | ICD-10-CM | POA: Diagnosis present

## 2014-04-02 DIAGNOSIS — IMO0001 Reserved for inherently not codable concepts without codable children: Secondary | ICD-10-CM | POA: Insufficient documentation

## 2014-04-02 DIAGNOSIS — R293 Abnormal posture: Secondary | ICD-10-CM | POA: Diagnosis not present

## 2014-04-02 NOTE — Evaluation (Signed)
Physical Therapy Evaluation  Patient Details  Name: Ian Mccann MRN: [id number] Date of Birth: [birth year]  Today's Date: 04/02/2014 Time: 3664-4034 PT Time Calculation (min): 45 min     Charges: 1 evaluation, TherEx [id number]         Visit#: 1 of 16  Re-eval: 05/02/14 Assessment Diagnosis: Low back pain secondary to limited trunk stability Next MD Visit: 06/14/14 Willey Blade Prior Therapy: no  Authorization: BCBS- Medicare      Past Medical History:  Past Medical History  Diagnosis Date  . Prostate cancer 08/2011    XRT  . Diabetes mellitus   . Hyperlipidemia   . Kidney stones   . Rosacea   . GERD (gastroesophageal reflux disease)   . Hypertension   . Arthritis   . Wears hearing aid   . HOH (hard of hearing)   . Full dentures   . Wears glasses     reading   Past Surgical History:  Past Surgical History  Procedure Laterality Date  . Colonoscopy  2009    Dr. Rourk--> left-sided diverticulosis, splenic flexure polyp (tubular adenoma), cecal polyp.  . Esophagogastroduodenoscopy  2006    Dr. Rourk--> subtle submucosal ring status post dilation, varicose mucosa with focal erosions in the antrum  . Colonoscopy  2006    Dr. Guerry Minors  . Colonoscopy N/A 03/31/2013    Procedure: COLONOSCOPY;  Surgeon: Daneil Dolin, MD;  [facility]: [facility];  Service: Endoscopy;  Laterality: N/A;  9:45  . Esophagogastroduodenoscopy (egd) with esophageal dilation N/A 03/31/2013    Procedure: ESOPHAGOGASTRODUODENOSCOPY (EGD) WITH ESOPHAGEAL DILATION;  Surgeon: Daneil Dolin, MD;  [facility]: [facility];  Service: Endoscopy;  Laterality: N/A;  . Inguinal hernia repair      2 on rt-1 on lt  . Appendectomy    . Eye surgery      both cataracts  . Trigger finger release Right 10/22/2013    Procedure: RELEASE RIGHT MIDDLE FINGER TRIGGER A-1 PULLEY;  Surgeon: Wynonia Sours, MD;  Location: [facility];  Service: Orthopedics;  Laterality: Right;  ANESTHESIA: IV REGIONAL/FAB     Subjective Symptoms/Limitations Symptoms: Pain, "my feet get cold",  Pertinent History: Patient has had a long history of back pain for at least several years. "I have had a weak back all my life." Patient utilizes a belt to help suck stomach in and notices improved pain. Hard of hearing, type 2 diabetes, prostate cancer in 2013, , history of hernia surgery,  How long can you sit comfortably?: no How long can you stand comfortably?: <86minutes How long can you walk comfortably?: <46minutes Pain Assessment Currently in Pain?: Yes Pain Score: 5  Pain Location: Back Pain Orientation: Lower;Medial Pain Type: Chronic pain Pain Radiating Towards: no Pain Onset: More than a month ago Pain Frequency: Intermittent Pain Relieving Factors: laying down and in [place] boy chair Effect of Pain on Daily Activities: difficulty walking, standing,   Cognition/Observation Observation/Other Assessments Observations: gait: limited hip excursions (transverse plane and frontal plane) excessive toe out.  Other Assessments: Posture: forward head, excessive lordosis, , forward flexed trunk   Sensation/Coordination/Flexibility/Functional Tests Flexibility Thomas: Positive Obers: Negative 90/90: Positive  Assessment RLE Strength Right Hip Flexion: 4/5 Right Hip External Rotation : 35 Right Hip Internal Rotation : 0 Right Knee Flexion: 4/5 Right Knee Extension: 4/5 Right Ankle Dorsiflexion: 4/5 LLE AROM (degrees) Left Hip External Rotation : 35 Left Hip Internal Rotation : 6 LLE Strength Left Hip Flexion: 4/5 Left Knee Flexion: 4/5 Left  Knee Extension: 4/5 Left Ankle Dorsiflexion: 4/5 Lumbar AROM Overall Lumbar AROM Comments: Severely limited, unable to assess due to pain and in ability to lay prone.   Exercise/Treatments hip flexors, groin, hamstring, piriformis stretches 4x 20 seconds  Physical Therapy Assessment and Plan PT Assessment and Plan Clinical Impression Statement: Patient  displays low back pain secondary to abnormal posture attributed to limited hip flexor mobility, limited hamstring mobility, limited priformis muscle mobility, limited groin musces mobility, and decreased abdomial and glut strength. These deficiencies result in abnormal postural lalignemnt and increased strain throughout lumbar spine.  Patient will benefit from skilled phsyical therapy to address these limitations improve posture and help patient return to walking > 36minutes Pt will benefit from skilled therapeutic intervention in order to improve on the following deficits: Abnormal gait;Decreased activity tolerance;Decreased balance;Difficulty walking;Decreased strength;Pain;Impaired flexibility;Obesity;Decreased range of motion;Improper body mechanics;Decreased mobility Rehab Potential: Fair Clinical Impairments Affecting Rehab Potential: long history of low back pain.  PT Frequency: Min 2X/week PT Duration: 8 weeks PT Treatment/Interventions: Gait training;Stair training;Functional mobility training;Therapeutic activities;Therapeutic exercise;Balance training;Modalities;Patient/family education PT Plan: Initial focus on improving LE ROM (hip flexors, groin, hamstring, pirifomris) and strunk stability. as ROM and pain increase focus to shift to increasing glut med max and hamstring strength.     Goals Home Exercise Program Pt/caregiver will Perform Home Exercise Program: For increased ROM PT Goal: Perform Home Exercise Program - Progress: Goal set today PT Short Term Goals Time to Complete Short Term Goals: 4 weeks PT Short Term Goal 1: Patient will be able to extend hips >5 degrees to stand upright PT Short Term Goal 2: Patient will be able to perform straight led raise to >60 degrees bilaterally to improved hamstrign mobility so patient can more easily bend forward PT Short Term Goal 3: Patient will increase groin muscle mobility so patient does not have pain when sittign with ankle over  opposite knee PT Short Term Goal 4: Patient will increase abdominal muscle srength to 3/5MMT to decrease pain with prolonged standing > 64minutes to <5/10 PT Long Term Goals Time to Complete Long Term Goals: 8 weeks PT Long Term Goal 1: Patient will be able to extend hips >15 degrees towalk with increased stride length PT Long Term Goal 2: Patient will be able to perform straight led raise to >80 degrees bilaterally to improved hamstring mobility so patient can touch toes Long Term Goal 3: Patient will increase abdominal muscle srength to 4/5MMT to decrease pain with prolonged standing > 330minutes to <3/10  Problem List Patient Active Problem List   Diagnosis Date Noted  . Lumbago 04/02/2014  . Abnormal posture 04/02/2014  . Muscle weakness (generalized) 04/02/2014  . Stiffness of joint, not elsewhere classified, pelvic region and thigh 04/02/2014  . Normocytic anemia 03/14/2013  . Hx of adenomatous colonic polyps 03/13/2013  . Esophageal dysphagia 03/13/2013  . Pancreatic cyst 03/13/2013    PT - End of Session Activity Tolerance: Patient tolerated treatment well General Behavior During Therapy: WFL for tasks assessed/performed PT Plan of Care PT Home Exercise Plan: hip flexors, groin, hamstring, pirifomris stretchs 4x 20 seconds Consulted and Agree with Plan of Care: Patient  GP Functional Assessment Tool Used: FOTO 50% limited  Functional Limitation: Changing and maintaining body position Changing and Maintaining Body Position Current Status (N5621): At least 40 percent but less than 60 percent impaired, limited or restricted Changing and Maintaining Body Position Goal Status (H0865): At least 20 percent but less than 40 percent impaired, limited or restricted  Justise Ehmann,  Nosson Wender R 04/02/2014, 7:13 PM  Physician Documentation Your signature is required to indicate approval of the treatment plan as stated above.  Please sign and either send electronically or make a copy of this  report for your files and return this physician signed original.   Please mark one 1.__approve of plan  2. ___approve of plan with the following conditions.   ______________________________                                                          _____________________ Physician Signature                                                                                                             Date

## 2014-04-07 ENCOUNTER — Ambulatory Visit (INDEPENDENT_AMBULATORY_CARE_PROVIDER_SITE_OTHER): Payer: Medicare Other | Admitting: Urology

## 2014-04-07 DIAGNOSIS — N3941 Urge incontinence: Secondary | ICD-10-CM

## 2014-04-07 DIAGNOSIS — C61 Malignant neoplasm of prostate: Secondary | ICD-10-CM

## 2014-04-08 ENCOUNTER — Ambulatory Visit (HOSPITAL_COMMUNITY)
Admission: RE | Admit: 2014-04-08 | Discharge: 2014-04-08 | Disposition: A | Discharge status: Home / Self Care | Payer: Medicare Other | Source: Ambulatory Visit | Attending: Internal Medicine | Admitting: Internal Medicine

## 2014-04-08 ENCOUNTER — Telehealth: Payer: Self-pay | Admitting: Internal Medicine

## 2014-04-08 DIAGNOSIS — IMO0001 Reserved for inherently not codable concepts without codable children: Secondary | ICD-10-CM | POA: Diagnosis not present

## 2014-04-08 DIAGNOSIS — R293 Abnormal posture: Secondary | ICD-10-CM | POA: Insufficient documentation

## 2014-04-08 DIAGNOSIS — M6281 Muscle weakness (generalized): Secondary | ICD-10-CM | POA: Insufficient documentation

## 2014-04-08 DIAGNOSIS — M25659 Stiffness of unspecified hip, not elsewhere classified: Secondary | ICD-10-CM | POA: Diagnosis not present

## 2014-04-08 DIAGNOSIS — M545 Low back pain, unspecified: Secondary | ICD-10-CM | POA: Insufficient documentation

## 2014-04-08 NOTE — Telephone Encounter (Signed)
PATIENT ON September RECALL LIST FOR CT

## 2014-04-08 NOTE — Progress Notes (Signed)
Physical Therapy Treatment Patient Details  Name: Ian Mccann MRN: [id number] Date of Birth: [birth year]  Today's Date: 04/08/2014 Time: 3086-5784 PT Time Calculation (min): 44 min TE [id number]  Visit#: 2 of 16  Re-eval: 05/02/14 Assessment Diagnosis: Low back pain secondary to limited trunk stability Next MD Visit: 06/14/14 Willey Blade   Subjective: Pain Assessment Currently in Pain?: Yes Pain Score: 3  Pain Location: Back Pain Orientation: Lower;Right;Left   Exercise/Treatments Stretches Active Hamstring Stretch: 4 reps;20 seconds;Limitations Active Hamstring Stretch Limitations: On 14" Box Hip Flexor Stretch: 4 reps;20 seconds;Limitations Hip Flexor Stretch Limitations: On 14" Box Piriformis Stretch: 4 reps;20 seconds;Limitations Piriformis Stretch Limitations: Seated with manual assist Standing Other Standing Lumbar Exercises: Gastroc/Groin Stretch 20" x4 Other Standing Lumbar Exercises: 3D Hip Excursion x10    Physical Therapy Assessment and Plan PT Assessment and Plan Clinical Impression Statement: Iniated PT treatment session, with focus on stretching and ROM exercises.  Pt required continuous VC/TC throughout program for correct positioning and alignment, particularly manual assist with groin stretch and piriformis stretch secondary to tightness.  Emphasized to patient importance of performing stretching program everyday to gain ROM/flexibility to decrease stress to low back.  Pt requiried multiple rest breaks throughout treatment, and encouragment to continue exercises with limited rest breaks.   Pt will benefit from skilled therapeutic intervention in order to improve on the following deficits: Abnormal gait;Decreased activity tolerance;Decreased balance;Difficulty walking;Decreased strength;Pain;Impaired flexibility;Obesity;Decreased range of motion;Improper body mechanics;Decreased mobility Rehab Potential: Fair PT Frequency: Min 2X/week PT Duration: 8 weeks PT  Treatment/Interventions: Gait training;Stair training;Functional mobility training;Therapeutic activities;Therapeutic exercise;Balance training;Modalities;Patient/family education PT Plan: Initial focus on improving LE ROM (hip flexors, groin, hamstring, pirifomris) and strunk stability. as ROM and pain increase focus to shift to increasing glut med max and hamstring strength.     Goals PT Short Term Goals PT Short Term Goal 1: Patient will be able to extend hips >5 degrees to stand upright PT Short Term Goal 1 - Progress: Progressing toward goal PT Short Term Goal 2: Patient will be able to perform straight led raise to >60 degrees bilaterally to improved hamstrign mobility so patient can more easily bend forward PT Short Term Goal 2 - Progress: Progressing toward goal PT Short Term Goal 3: Patient will increase groin muscle mobility so patient does not have pain when sittign with ankle over opposite knee PT Short Term Goal 3 - Progress: Progressing toward goal  Problem List Patient Active Problem List   Diagnosis Date Noted  . Lumbago 04/02/2014  . Abnormal posture 04/02/2014  . Muscle weakness (generalized) 04/02/2014  . Stiffness of joint, not elsewhere classified, pelvic region and thigh 04/02/2014  . Normocytic anemia 03/14/2013  . Hx of adenomatous colonic polyps 03/13/2013  . Esophageal dysphagia 03/13/2013  . Pancreatic cyst 03/13/2013    PT - End of Session Activity Tolerance: Patient limited by fatigue General Behavior During Therapy: [facility] for tasks assessed/performed   Pairlee Sawtell 04/08/2014, 2:44 PM

## 2014-04-09 ENCOUNTER — Encounter: Payer: Self-pay | Admitting: Internal Medicine

## 2014-04-09 NOTE — Telephone Encounter (Signed)
Letter has been mailed.

## 2014-04-10 ENCOUNTER — Ambulatory Visit (HOSPITAL_COMMUNITY)
Admission: RE | Admit: 2014-04-10 | Discharge: 2014-04-10 | Disposition: A | Discharge status: Home / Self Care | Payer: Medicare Other | Source: Ambulatory Visit | Attending: Internal Medicine | Admitting: Internal Medicine

## 2014-04-10 DIAGNOSIS — IMO0001 Reserved for inherently not codable concepts without codable children: Secondary | ICD-10-CM | POA: Diagnosis not present

## 2014-04-10 NOTE — Progress Notes (Signed)
Physical Therapy Treatment Patient Details  Name: Ian Mccann MRN: [id number] Date of Birth: Nov 02, 1928  Today's Date: 04/10/2014 Time: 1300-1350 PT Time Calculation (min): 50 min Charge: TE 1300-1350  Visit#: 3 of 16  Re-eval: 05/02/14 Assessment Diagnosis: Low back pain secondary to limited trunk stability Next MD Visit: Willey Blade 06/14/14 [place] Prior Therapy: no  Authorization: BCBS- Medicare   Authorization Time Period:    Authorization Visit#:   of     Subjective: Symptoms/Limitations Symptoms: Pt reports comliance with HEP and reports legs are feeling better.  Stated back feels good as long as he is seated, when he gets up walking he has to sit for 20" then relief.  Noted improvement with ability to cross legs by himself without HHA Pain Assessment Currently in Pain?: Yes Pain Score: 5  Pain Location: Back Pain Orientation: Lower  Objective:   Exercise/Treatments Stretches Active Hamstring Stretch: 3 reps;30 seconds;Limitations Active Hamstring Stretch Limitations: On 14" Box Hip Flexor Stretch: 2 reps;20 seconds;Limitations Hip Flexor Stretch Limitations: On 14" Box Piriformis Stretch: 4 reps;20 seconds;Limitations Piriformis Stretch Limitations: Seated with manual assist Standing Other Standing Lumbar Exercises: Posture awareness with visual and verbalcueing Other Standing Lumbar Exercises: 3D Hip Excursion x10 Seated Other Seated Lumbar Exercises: Thoracic excursion 5x    Physical Therapy Assessment and Plan PT Assessment and Plan Clinical Impression Statement: Continued session focus on stretches to improve ROM and increased awareness of posture for LBP control.  Pt required multimodal cueing for correct form and positioning.  Began 3D thoracic excursion to improve thoracic mobilty and posture.  Noted improved hip mobilty with increased ease with piriformis stretch, no HHA required.   PT Plan: Initial focus on improving LE ROM (hip flexors, groin, hamstring,  pirifomris) and strunk stability. as ROM and pain increase focus to shift to increasing glut med max and hamstring strength.     Goals PT Short Term Goals PT Short Term Goal 1: Patient will be able to extend hips >5 degrees to stand upright PT Short Term Goal 1 - Progress: Progressing toward goal PT Short Term Goal 2: Patient will be able to perform straight led raise to >60 degrees bilaterally to improved hamstrign mobility so patient can more easily bend forward PT Short Term Goal 2 - Progress: Progressing toward goal PT Short Term Goal 3: Patient will increase groin muscle mobility so patient does not have pain when sittign with ankle over opposite knee PT Short Term Goal 3 - Progress: Progressing toward goal PT Short Term Goal 4: Patient will increase abdominal muscle srength to 3/5MMT to decrease pain with prolonged standing > 36minutes to <5/10 PT Long Term Goals PT Long Term Goal 1: Patient will be able to extend hips >15 degrees towalk with increased stride length PT Long Term Goal 2: Patient will be able to perform straight led raise to >80 degrees bilaterally to improved hamstring mobility so patient can touch toes Long Term Goal 3: Patient will increase abdominal muscle srength to 4/5MMT to decrease pain with prolonged standing > 374minutes to <3/10  Problem List Patient Active Problem List   Diagnosis Date Noted  . Lumbago 04/02/2014  . Abnormal posture 04/02/2014  . Muscle weakness (generalized) 04/02/2014  . Stiffness of joint, not elsewhere classified, pelvic region and thigh 04/02/2014  . Normocytic anemia 03/14/2013  . Hx of adenomatous colonic polyps 03/13/2013  . Esophageal dysphagia 03/13/2013  . Pancreatic cyst 03/13/2013    PT - End of Session Activity Tolerance: Patient limited by fatigue;Patient tolerated treatment  well General Behavior During Therapy: [facility] for tasks assessed/performed  GP    Aldona Lento 04/10/2014, 3:42 PM

## 2014-04-14 ENCOUNTER — Ambulatory Visit (HOSPITAL_COMMUNITY)
Admission: RE | Admit: 2014-04-14 | Discharge: 2014-04-14 | Disposition: A | Discharge status: Home / Self Care | Payer: Medicare Other | Source: Ambulatory Visit | Attending: Internal Medicine | Admitting: Internal Medicine

## 2014-04-14 DIAGNOSIS — IMO0001 Reserved for inherently not codable concepts without codable children: Secondary | ICD-10-CM | POA: Diagnosis not present

## 2014-04-14 NOTE — Progress Notes (Signed)
Physical Therapy Treatment Patient Details  Name: Ian Mccann MRN: [id number] Date of Birth: 09-04-1928  Today's Date: 04/14/2014 Time: 2841-3244 PT Time Calculation (min): 43 min Charge: TE [id number]  Visit#: 4 of 16  Re-eval: 05/02/14 Assessment Diagnosis: Low back pain secondary to limited trunk stability Next MD Visit: Willey Blade 06/14/14 [place] Prior Therapy: no  Authorization: BCBS- Medicare   Authorization Time Period:    Authorization Visit#:   of     Subjective: Symptoms/Limitations Symptoms: Pt stated lower back is feeling better, increased ease crossing legs for piriformis Pain Assessment Currently in Pain?: Yes Pain Score: 3  Pain Location: Back Pain Orientation: Lower  Objective:  Exercise/Treatments Stretches Active Hamstring Stretch: 3 reps;30 seconds;Limitations Active Hamstring Stretch Limitations: On 14" Box Hip Flexor Stretch: 3 reps;20 seconds;Limitations Hip Flexor Stretch Limitations: 8in box hip flexor and groin Piriformis Stretch: 4 reps;20 seconds;Limitations Piriformis Stretch Limitations: Seated with manual assist Standing Other Standing Lumbar Exercises: Posture awareness with visual and verbalcueing Other Standing Lumbar Exercises: 3D Hip Excursion x10 Seated Other Seated Lumbar Exercises: Cervical and thoracic excursion 5x Other Seated Lumbar Exercises: rows 5x      Physical Therapy Assessment and Plan PT Assessment and Plan Clinical Impression Statement: Pt improving awareness with body positiioning with posture, noted self cueing "to stand tall" during standing stretches.  Pt continues to present with hyperkyphotic posturein seated and standing position, requiring cueing to look up.  Added 3D cervical excursion to improve cervical extension with noted restricted cervical AROM.  Pt reported pain reduced at end of session with improved standing posutre with minimal cueing required.   PT Plan: Initial focus on improving LE ROM (hip flexors,  groin, hamstring, pirifomris) and strunk stability. as ROM and pain increase focus to shift to increasing glut med max and hamstring strength.     Goals PT Short Term Goals PT Short Term Goal 1: Patient will be able to extend hips >5 degrees to stand upright PT Short Term Goal 1 - Progress: Progressing toward goal PT Short Term Goal 2: Patient will be able to perform straight led raise to >60 degrees bilaterally to improved hamstrign mobility so patient can more easily bend forward PT Short Term Goal 2 - Progress: Progressing toward goal PT Short Term Goal 3: Patient will increase groin muscle mobility so patient does not have pain when sittign with ankle over opposite knee PT Short Term Goal 3 - Progress: Progressing toward goal PT Short Term Goal 4: Patient will increase abdominal muscle srength to 3/5MMT to decrease pain with prolonged standing > 42minutes to <5/10 PT Long Term Goals PT Long Term Goal 1: Patient will be able to extend hips >15 degrees towalk with increased stride length PT Long Term Goal 2: Patient will be able to perform straight led raise to >80 degrees bilaterally to improved hamstring mobility so patient can touch toes Long Term Goal 3: Patient will increase abdominal muscle srength to 4/5MMT to decrease pain with prolonged standing > 343minutes to <3/10  Problem List Patient Active Problem List   Diagnosis Date Noted  . Lumbago 04/02/2014  . Abnormal posture 04/02/2014  . Muscle weakness (generalized) 04/02/2014  . Stiffness of joint, not elsewhere classified, pelvic region and thigh 04/02/2014  . Normocytic anemia 03/14/2013  . Hx of adenomatous colonic polyps 03/13/2013  . Esophageal dysphagia 03/13/2013  . Pancreatic cyst 03/13/2013    PT - End of Session Activity Tolerance: Patient tolerated treatment well General Behavior During Therapy: [facility] for tasks assessed/performed  GP  Aldona Lento 04/14/2014, 2:39 PM

## 2014-04-16 ENCOUNTER — Other Ambulatory Visit: Payer: Self-pay

## 2014-04-16 ENCOUNTER — Ambulatory Visit (HOSPITAL_COMMUNITY)
Admission: RE | Admit: 2014-04-16 | Discharge: 2014-04-16 | Disposition: A | Discharge status: Home / Self Care | Payer: Medicare Other | Source: Ambulatory Visit | Attending: Internal Medicine | Admitting: Internal Medicine

## 2014-04-16 DIAGNOSIS — K862 Cyst of pancreas: Secondary | ICD-10-CM

## 2014-04-16 DIAGNOSIS — IMO0001 Reserved for inherently not codable concepts without codable children: Secondary | ICD-10-CM | POA: Diagnosis not present

## 2014-04-16 NOTE — Progress Notes (Signed)
Physical Therapy Treatment Patient Details  Name: Ian Mccann MRN: [id number] Date of Birth: [birth year]  Today's Date: 04/16/2014 Time: 1030-1110 PT Time Calculation (min): 40 min Charge:  There ex 1030-1110 Visit#: 5 of 16  Re-eval: 05/02/14   Authorization: BCBS- Medicare   Authorization Visit#: 5 of 10   Subjective: Symptoms/Limitations Symptoms: Pt states he is doing some of his exercises at home but admits to sitting in the relincer most of ther day  Pain Assessment Pain Score: 3  Pain Location: Back Pain Orientation: Lower Pain Type: Chronic pain    Exercise/Treatments  Stretches Active Hamstring Stretch: 3 reps;30 seconds;Limitations Active Hamstring Stretch Limitations: On 14" Box Piriformis Stretch: 20 seconds;Limitations;2 reps Piriformis Stretch Limitations: Seated with manual assist Standing Heel Raises: 10 reps Functional Squats: 10 reps Scapular Retraction: 10 reps;Theraband Theraband Level (Scapular Retraction): Level 3 (Green) Row: 10 reps;Theraband Theraband Level (Row): Level 3 (Green) Shoulder Extension: 10 reps;Theraband Theraband Level (Shoulder Extension): Level 3 (Green) Other Standing Lumbar Exercises: Posture awareness with visual and verbalcueing Other Standing Lumbar Exercises: 3D Hip Excursion x10 Seated Other Seated Lumbar Exercises: Cervical and thoracic excursion 5x Other Seated Lumbar Exercises: sitting in good posture scapular retraction x 10; glut/ab set combo x 10      Physical Therapy Assessment and Plan PT Assessment and Plan Clinical Impression Statement: Pt continues to improve in standing posture; added t-band postural exercises for improved strengthening.  Pt has very limited tolerance for standing needing multiple rest breaks.  Therapist facilitated standing exercises to ensure safety of pt.   PT Plan: begin sit to stand for glut max strengthening as well as standing hip abduction for glut med; ham curls for hamstring  strengthening next visit.     Goals  progressing   Problem List Patient Active Problem List   Diagnosis Date Noted  . Lumbago 04/02/2014  . Abnormal posture 04/02/2014  . Muscle weakness (generalized) 04/02/2014  . Stiffness of joint, not elsewhere classified, pelvic region and thigh 04/02/2014  . Normocytic anemia 03/14/2013  . Hx of adenomatous colonic polyps 03/13/2013  . Esophageal dysphagia 03/13/2013  . Pancreatic cyst 03/13/2013       GP    Ansleigh Safer,CINDY 04/16/2014, 11:31 AM

## 2014-04-16 NOTE — Progress Notes (Signed)
Patient ID: Ian Mccann, male   DOB: Jun 05, 1929, 80 y.o.   MRN: [id number] Pt's wife called today wanting to schedule her husbands CT scan. We have him set up on 04-21-14 @ 1000. Wife is aware and will go by to pick up contrast.

## 2014-04-20 ENCOUNTER — Telehealth (HOSPITAL_COMMUNITY): Payer: Self-pay | Admitting: Internal Medicine

## 2014-04-20 ENCOUNTER — Ambulatory Visit (HOSPITAL_COMMUNITY): Payer: Medicare Other | Admitting: Physical Therapy

## 2014-04-21 ENCOUNTER — Ambulatory Visit (HOSPITAL_COMMUNITY)
Admission: RE | Admit: 2014-04-21 | Discharge: 2014-04-21 | Disposition: A | Discharge status: Home / Self Care | Payer: Medicare Other | Source: Ambulatory Visit | Attending: Internal Medicine | Admitting: Internal Medicine

## 2014-04-21 DIAGNOSIS — I700 Atherosclerosis of aorta: Secondary | ICD-10-CM | POA: Insufficient documentation

## 2014-04-21 DIAGNOSIS — K863 Pseudocyst of pancreas: Secondary | ICD-10-CM | POA: Diagnosis present

## 2014-04-21 DIAGNOSIS — K862 Cyst of pancreas: Secondary | ICD-10-CM

## 2014-04-21 LAB — POCT I-STAT CREATININE: Creatinine, Ser: 1.1 mg/dL (ref 0.50–1.35)

## 2014-04-21 MED ORDER — IOHEXOL 300 MG/ML  SOLN
100.0000 mL | Freq: Once | INTRAMUSCULAR | Status: AC | PRN
  Administered 2014-04-21: 100 mL via INTRAVENOUS

## 2014-04-22 NOTE — Progress Notes (Signed)
NIC'D °

## 2014-04-24 ENCOUNTER — Ambulatory Visit (HOSPITAL_COMMUNITY)
Admission: RE | Admit: 2014-04-24 | Discharge: 2014-04-24 | Disposition: A | Discharge status: Home / Self Care | Payer: Medicare Other | Source: Ambulatory Visit | Attending: Physical Therapy | Admitting: Physical Therapy

## 2014-04-24 DIAGNOSIS — IMO0001 Reserved for inherently not codable concepts without codable children: Secondary | ICD-10-CM | POA: Diagnosis not present

## 2014-04-24 NOTE — Progress Notes (Signed)
Physical Therapy Treatment Patient Details  Name: Ian Mccann MRN: [id number] Date of Birth: [birth year]  Today's Date: 04/24/2014 Time: 0938-1829 PT Time Calculation (min): 43 min   Charges: TE [id number] Visit#: 6 of 16  Re-eval: 05/02/14 Assessment Diagnosis: Low back pain secondary to limited trunk stability Next MD Visit: Willey Blade 06/14/14 [place] Prior Therapy: no  Authorization: BCBS- Medicare   Authorization Visit#: 6 of 10   Subjective: Symptoms/Limitations Symptoms: patient reports no pain this session at reast , noted no pain durin session.  Pain Assessment Currently in Pain?: No/denies  Exercise/Treatments Stretches Active Hamstring Stretch: 3 reps;30 seconds;Limitations Active Hamstring Stretch Limitations: On 14" Box Hip Flexor Stretch Limitations: 10x 3" onto 14" box Piriformis Stretch: 20 seconds;Limitations;2 reps Piriformis Stretch Limitations: Seated with manual assist Standing Functional Squats: Limitations;20 reps Functional Squats Limitations: neutral Scapular Retraction: 10 reps;Theraband Theraband Level (Scapular Retraction): Level 3 (Green) Row: 10 reps;Theraband Theraband Level (Row): Level 3 (Green) Other Standing Lumbar Exercises: Posture awareness with visual and verbalcueing Other Standing Lumbar Exercises: 3D Hip Excursion x10 Seated Other Seated Lumbar Exercises: Thoracic spine excursion 10x with arms over chest wiith therapist assist to increase ROM.  Other Seated Lumbar Exercises: seated forward press10x with 3lb dumbbell  Physical Therapy Assessment and Plan PT Assessment and Plan Clinical Impression Statement: Patient displays improved standing posture but quickly fatigues secondary to limited tunk stability/strength and weakness in hips and knees resulting in Rt knee buckling. Therapist faciliatated 3D thoracic spine excursion to increase ROM which resulted in further improved posture.  PT Plan: begin sit to stand for glut max strengthening  as well as standing hip abduction for glut med; ham curls for hamstring strengthening next visit.     Goals PT Short Term Goals PT Short Term Goal 1: Patient will be able to extend hips >5 degrees to stand upright PT Short Term Goal 1 - Progress: Progressing toward goal PT Short Term Goal 2: Patient will be able to perform straight led raise to >60 degrees bilaterally to improved hamstrign mobility so patient can more easily bend forward PT Short Term Goal 2 - Progress: Progressing toward goal PT Short Term Goal 3: Patient will increase groin muscle mobility so patient does not have pain when sittign with ankle over opposite knee PT Short Term Goal 3 - Progress: Progressing toward goal PT Short Term Goal 4: Patient will increase abdominal muscle srength to 3/5MMT to decrease pain with prolonged standing > 73minutes to <5/10 PT Short Term Goal 4 - Progress: Progressing toward goal  Problem List Patient Active Problem List   Diagnosis Date Noted  . Lumbago 04/02/2014  . Abnormal posture 04/02/2014  . Muscle weakness (generalized) 04/02/2014  . Stiffness of joint, not elsewhere classified, pelvic region and thigh 04/02/2014  . Normocytic anemia 03/14/2013  . Hx of adenomatous colonic polyps 03/13/2013  . Esophageal dysphagia 03/13/2013  . Pancreatic cyst 03/13/2013    PT - End of Session Activity Tolerance: Patient tolerated treatment well General Behavior During Therapy: [facility] for tasks assessed/performed  GP    Natika Geyer R PT DPT 04/24/2014, 12:29 PM

## 2014-04-27 ENCOUNTER — Ambulatory Visit (HOSPITAL_COMMUNITY)
Admission: RE | Admit: 2014-04-27 | Discharge: 2014-04-27 | Disposition: A | Discharge status: Home / Self Care | Payer: Medicare Other | Source: Ambulatory Visit | Attending: Physical Therapy | Admitting: Physical Therapy

## 2014-04-27 DIAGNOSIS — IMO0001 Reserved for inherently not codable concepts without codable children: Secondary | ICD-10-CM | POA: Diagnosis not present

## 2014-04-27 NOTE — Progress Notes (Signed)
Physical Therapy Treatment Patient Details  Name: Ian Mccann MRN: [id number] Date of Birth: 06/12/1929  Today's Date: 04/27/2014 Time: 1125-1150 PT Time Calculation (min): 25 min Charge:  There ex 1125 -1150 Visit#: 7 of 16  Re-eval: 05/02/14    Authorization: BCBS- Medicare    Authorization Visit#: 7 of 10   Subjective: Symptoms/Limitations Symptoms: Pt states he is not doing his exercises at home  Pain Assessment Currently in Pain?: Yes Pain Score: 2  Pain Location: Back Pain Orientation: Lower Pain Type: Chronic pain Exercise/Treatments     Stretches Single Knee to Chest Stretch: 3 reps;30 seconds;Limitations Single Knee to Chest Stretch Limitations: with overpressure at both legs by therapist  Lower Trunk Rotation: 3 reps;30 seconds Piriformis Stretch: 30 seconds;3 reps Standing Other Standing Lumbar Exercises: B UE flexion  at wall  x 10 for posture.  Supine Ab Set: 10 reps Bent Knee Raise: 10 reps Bridge: 10 reps Prone  Straight Leg Raise: 10 reps Other Prone Lumbar Exercises: heel squeeze x 10      Physical Therapy Assessment and Plan PT Assessment and Plan Clinical Impression Statement: Pt had days confused and was willing to wait and have a shorter treatment vs. coming back tomorrow.  Pt states he is unable to do current exercises at home therefore given pt mat exercises to work on stabillity at home.  Pt reviewed all exercises with pt demonstrating the abiltity to complete although pt did have difficulty with them due to weakness.  PT Plan: begin sit to stand for glut max strengthening as well as standing hip abduction for glut med; ham curls for hamstring strengthening next visit; these were not added on this visit secondary to limited time today.     Goals    Problem List Patient Active Problem List   Diagnosis Date Noted  . Lumbago 04/02/2014  . Abnormal posture 04/02/2014  . Muscle weakness (generalized) 04/02/2014  . Stiffness of joint, not  elsewhere classified, pelvic region and thigh 04/02/2014  . Normocytic anemia 03/14/2013  . Hx of adenomatous colonic polyps 03/13/2013  . Esophageal dysphagia 03/13/2013  . Pancreatic cyst 03/13/2013    PT Plan of Care PT Home Exercise Plan: new HEP given  GP    Alonte Wulff,CINDY 04/27/2014, 3:39 PM

## 2014-04-28 ENCOUNTER — Ambulatory Visit (HOSPITAL_COMMUNITY): Payer: Medicare Other | Admitting: Physical Therapy

## 2014-05-01 ENCOUNTER — Ambulatory Visit (HOSPITAL_COMMUNITY)
Admission: RE | Admit: 2014-05-01 | Discharge: 2014-05-01 | Disposition: A | Discharge status: Home / Self Care | Payer: Medicare Other | Source: Ambulatory Visit | Attending: Internal Medicine | Admitting: Internal Medicine

## 2014-05-01 DIAGNOSIS — IMO0001 Reserved for inherently not codable concepts without codable children: Secondary | ICD-10-CM | POA: Diagnosis not present

## 2014-05-01 NOTE — Progress Notes (Signed)
Physical Therapy Treatment Patient Details  Name: Ian Mccann MRN: [id number] Date of Birth: [birth year]  Today's Date: 05/01/2014 Time: 6568-1275 PT Time Calculation (min): 23 min   TE 1700-1749 Visit#: 8 of 16  Re-eval: 05/06/14 Assessment Diagnosis: Low back pain secondary to limited trunk stability Next MD Visit: Willey Blade 06/14/14 [place] Prior Therapy: no  Authorization: BCBS- Medicare   Authorization Visit#: 8 of 10   Subjective: Pain Assessment Pain Location: Back Pain Orientation: Lower Pain Type: Chronic pain  Exercise/Treatments Stretches Lower Trunk Rotation: 3 reps;30 seconds Piriformis Stretch: 30 seconds;3 reps Standing Functional Squats Limitations: neutral, and split stance each 10x Other Standing Lumbar Exercises: Standing extension: 20x  Other Standing Lumbar Exercises: 3D Hip Excursion x10 Seated Other Seated Lumbar Exercises: Thoracic spine excursion 10x with arms over chest wiith therapist assist to increase ROM.   Physical Therapy Assessment and Plan PT Assessment and Plan Clinical Impression Statement: Patient late to therapy, for which he attribbutes to weather, resulting in limited time for treatment. Patient also appears to erther have slow processing (which this therapist does not believe true) or is stalling during therapy resulting in slow performance of exercises and limited treatment.  Session focused on LE strengthening and mobility exercises to improve stanidng tolerance and balance . Mobilization with movemnt performed to lumbar spine to improve up right posture.  PT Plan: continue sit to stand for glut max strengthening as well as standing hip abduction for glut med; ham curls for hamstring strengthening next visit; these were not added on this visit secondary to limited time today.     Problem List Patient Active Problem List   Diagnosis Date Noted  . Lumbago 04/02/2014  . Abnormal posture 04/02/2014  . Muscle weakness (generalized) 04/02/2014   . Stiffness of joint, not elsewhere classified, pelvic region and thigh 04/02/2014  . Normocytic anemia 03/14/2013  . Hx of adenomatous colonic polyps 03/13/2013  . Esophageal dysphagia 03/13/2013  . Pancreatic cyst 03/13/2013    PT - End of Session Activity Tolerance: Patient tolerated treatment well General Behavior During Therapy: [facility] for tasks assessed/performed  GP    Darreon Lutes R 05/01/2014, 12:07 PM

## 2014-05-04 ENCOUNTER — Ambulatory Visit (HOSPITAL_COMMUNITY)
Admission: RE | Admit: 2014-05-04 | Discharge: 2014-05-04 | Disposition: A | Discharge status: Home / Self Care | Payer: Medicare Other | Source: Ambulatory Visit | Attending: Internal Medicine | Admitting: Internal Medicine

## 2014-05-04 DIAGNOSIS — IMO0001 Reserved for inherently not codable concepts without codable children: Secondary | ICD-10-CM | POA: Diagnosis not present

## 2014-05-04 NOTE — Progress Notes (Signed)
Physical Therapy Treatment Patient Details  Name: Ian Mccann MRN: [id number] Date of Birth: May 16, 1929  Today's Date: 05/04/2014 Time: 3086-5784 PT Time Calculation (min): 45 min  Visit#: 9 of 16  Re-eval: 05/06/14 Authorization: BCBS- Medicare   Authorization Visit#: 9 of 10  Charges:  threrex 45'  Subjective: Symptoms/Limitations Symptoms: Pt reports central LB pain of 3/10.  States he has done "some" of his exericses at home. CG reports she sees him do very little at home. Pain Assessment Currently in Pain?: Yes Pain Score: 3    Exercise/Treatments Stretches Active Hamstring Stretch: 3 reps;30 seconds;Limitations Active Hamstring Stretch Limitations: On 14" Box Piriformis Stretch: 30 seconds;3 reps Piriformis Stretch Limitations: Seated  Standing Functional Squats: 10 reps Functional Squats Limitations: neutral, and split stance each 10x Other Standing Lumbar Exercises: hamstring curls 10 reps, gastroc stretch slant board 3X30" Other Standing Lumbar Exercises: 3D Hip Excursion x10 Seated Other Seated Lumbar Exercises: Thoracic spine excursion 10x with arms over chest wiith therapist assist to increase ROM.  Other Seated Lumbar Exercises: STS without UE's 5 reps    Physical Therapy Assessment and Plan PT Assessment and Plan Clinical Impression Statement: Pt requires constant cues to maintain and establish erect posture with therex.  pt with limited actvity tolerance, requiring frequent rest breaks.  Extreme tightness in gastrocs as noted with gastroc stretch.  Only 1/4 of foot on wedge before stretch  obtained.    Began STS acttivity without UE's and hamstring curls today. PT Plan: Continue to progress toward goals.  Add standing hip abduction against wall next visit.  Gcode update due next visit. Re-evaluation due also.   Problem List Patient Active Problem List   Diagnosis Date Noted  . Lumbago 04/02/2014  . Abnormal posture 04/02/2014  . Muscle weakness  (generalized) 04/02/2014  . Stiffness of joint, not elsewhere classified, pelvic region and thigh 04/02/2014  . Normocytic anemia 03/14/2013  . Hx of adenomatous colonic polyps 03/13/2013  . Esophageal dysphagia 03/13/2013  . Pancreatic cyst 03/13/2013    PT - End of Session Activity Tolerance: Patient tolerated treatment well General Behavior During Therapy: [facility] for tasks assessed/performed  GP    Teena Irani, PTA/CLT 05/04/2014, 11:42 AM

## 2014-05-07 ENCOUNTER — Inpatient Hospital Stay (HOSPITAL_COMMUNITY): Admission: RE | Admit: 2014-05-07 | Payer: Medicare Other | Source: Ambulatory Visit | Admitting: Physical Therapy

## 2014-05-07 ENCOUNTER — Telehealth (HOSPITAL_COMMUNITY): Payer: Self-pay

## 2014-05-07 NOTE — Telephone Encounter (Signed)
Doesn't feel like coming today

## 2014-05-11 ENCOUNTER — Ambulatory Visit (HOSPITAL_COMMUNITY)
Admission: RE | Admit: 2014-05-11 | Discharge: 2014-05-11 | Disposition: A | Discharge status: Home / Self Care | Payer: Medicare Other | Source: Ambulatory Visit | Attending: Internal Medicine | Admitting: Internal Medicine

## 2014-05-11 DIAGNOSIS — M545 Low back pain: Secondary | ICD-10-CM | POA: Diagnosis not present

## 2014-05-11 DIAGNOSIS — M6281 Muscle weakness (generalized): Secondary | ICD-10-CM | POA: Diagnosis not present

## 2014-05-11 DIAGNOSIS — R262 Difficulty in walking, not elsewhere classified: Secondary | ICD-10-CM | POA: Diagnosis not present

## 2014-05-11 DIAGNOSIS — Z5189 Encounter for other specified aftercare: Secondary | ICD-10-CM | POA: Diagnosis not present

## 2014-05-11 DIAGNOSIS — M25659 Stiffness of unspecified hip, not elsewhere classified: Secondary | ICD-10-CM | POA: Diagnosis not present

## 2014-05-11 NOTE — Progress Notes (Signed)
Physical Therapy Re-evaluation  Patient Details  Name: Ian Mccann MRN: [id number] Date of Birth: 1928/12/31  Today's Date: 05/11/2014 Time: 1100-1200 PT Time Calculation (min): 60 min            Visit#: 10 of 18  Re-eval: 05/06/14 Assessment Diagnosis: Low back pain secondary to limited trunk stability Authorization: BCBS- Medicare     Authorization Visit#: 10 of 20    Subjective Symptoms/Limitations Symptoms: Pt states he feels he's getting stronger.  Pt states he continues to have central LB pain, however not as frequent.  States he puts a pillow into his lumbar region to help support and prevent pain.  Pt states pain varies 0-7/10, but mostly stays around 3/10. Pain Assessment Currently in Pain?: Yes Pain Score: 3  Pain Location: Back Pain Orientation: Mid;Lower   Assessment RLE Strength Right Hip Flexion: 4/5 (was 4/5) Right Hip Extension: 3-/5 (n/t ) Right Hip External Rotation : 15 (was 35 degrees) Right Hip Internal Rotation : 20 (was 0 degrees) Right Knee Flexion: 5/5 (was 4/5) Right Knee Extension:  (was 4/5) Right Ankle Dorsiflexion: 5/5 (was 4/5) LLE AROM (degrees) Left Hip External Rotation : 36 (was 35 degrees) Left Hip Internal Rotation : 5 (was 6 degrees) LLE Strength Left Hip Flexion: 5/5 (was 4/5) Left Hip Extension: 3-/5 (n/t) Left Knee Flexion: 5/5 (was 4/5) Left Knee Extension:  (was 4/5) Left Ankle Dorsiflexion: 5/5 (was 4/5) Lumbar AROM Overall Lumbar AROM Comments: able to lay prone now (was unable)     Physical Therapy Assessment and Plan PT Assessment and Plan Clinical Impression Statement: Pt has completed 10 PT visits for Low back pain.  Pt has met 2/4 STG's and is progressing towards LTG's.  Pt/spouse admits to patient not being fully compliant with HEP, however reports more postural awareness and overall stronger.  Pt would benefit from continued therapy to further progress strength, reduce pain and meet goals  Pt will benefit from  skilled therapeutic intervention in order to improve on the following deficits: Abnormal gait;Decreased activity tolerance;Decreased balance;Difficulty walking;Decreased strength;Pain;Impaired flexibility;Obesity;Decreased range of motion;Improper body mechanics;Decreased mobility PT Frequency: Min 2X/week PT Duration: 4 weeks PT Treatment/Interventions: Gait training;Stair training;Functional mobility training;Therapeutic activities;Therapeutic exercise;Balance training;Modalities;Patient/family education PT Plan: Recommend to continue therapy X 4 more weeks.    Goals PT Short Term Goals PT Short Term Goal 1: Patient will be able to extend hips >5 degrees to stand upright PT Short Term Goal 1 - Progress: Progressing toward goal PT Short Term Goal 2: Patient will be able to perform straight led raise to >60 degrees bilaterally to improved hamstrign mobility so patient can more easily bend forward PT Short Term Goal 2 - Progress: Met PT Short Term Goal 3: Patient will increase groin muscle mobility so patient does not have pain when sittign with ankle over opposite knee (no longer has pain, just feels tightness) PT Short Term Goal 3 - Progress: Met PT Short Term Goal 4: Patient will increase abdominal muscle srength to 3/5MMT to decrease pain with prolonged standing > 46mnutes to <5/10 PT Short Term Goal 4 - Progress: Progressing toward goal PT Long Term Goals PT Long Term Goal 1 - Progress: Progressing toward goal PT Long Term Goal 2 - Progress: Progressing toward goal Long Term Goal 3 Progress: Progressing toward goal  Problem List Patient Active Problem List   Diagnosis Date Noted  . Lumbago 04/02/2014  . Abnormal posture 04/02/2014  . Muscle weakness (generalized) 04/02/2014  . Stiffness of joint, not elsewhere classified,  pelvic region and thigh 04/02/2014  . Normocytic anemia 03/14/2013  . Hx of adenomatous colonic polyps 03/13/2013  . Esophageal dysphagia 03/13/2013  .  Pancreatic cyst 03/13/2013    PT - End of Session Activity Tolerance: Patient tolerated treatment well General Behavior During Therapy: [facility] for tasks assessed/performed  GP Functional Assessment Tool Used: FOTO 42% limited (was 50% limited)  Functional Limitation: Changing and maintaining body position Changing and Maintaining Body Position Current Status (W8032): At least 40 percent but less than 60 percent impaired, limited or restricted Changing and Maintaining Body Position Goal Status (Z2248): At least 20 percent but less than 40 percent impaired, limited or restricted  Devona Konig PT DPT  Teena Irani, PTA/CLT 05/11/2014, 12:19 PM

## 2014-05-11 NOTE — Progress Notes (Signed)
Physical Therapy Re-evaluation  Patient Details  Name: Ian Mccann MRN: [id number] Date of Birth: 1928/12/31  Today's Date: 05/11/2014 Time: 1100-1200 PT Time Calculation (min): 60 min            Visit#: 10 of 18  Re-eval: 05/06/14 Assessment Diagnosis: Low back pain secondary to limited trunk stability Authorization: BCBS- Medicare     Authorization Visit#: 10 of 20    Subjective Symptoms/Limitations Symptoms: Pt states he feels he's getting stronger.  Pt states he continues to have central LB pain, however not as frequent.  States he puts a pillow into his lumbar region to help support and prevent pain.  Pt states pain varies 0-7/10, but mostly stays around 3/10. Pain Assessment Currently in Pain?: Yes Pain Score: 3  Pain Location: Back Pain Orientation: Mid;Lower   Assessment RLE Strength Right Hip Flexion: 4/5 (was 4/5) Right Hip Extension: 3-/5 (n/t ) Right Hip External Rotation : 15 (was 35 degrees) Right Hip Internal Rotation : 20 (was 0 degrees) Right Knee Flexion: 5/5 (was 4/5) Right Knee Extension:  (was 4/5) Right Ankle Dorsiflexion: 5/5 (was 4/5) LLE AROM (degrees) Left Hip External Rotation : 36 (was 35 degrees) Left Hip Internal Rotation : 5 (was 6 degrees) LLE Strength Left Hip Flexion: 5/5 (was 4/5) Left Hip Extension: 3-/5 (n/t) Left Knee Flexion: 5/5 (was 4/5) Left Knee Extension:  (was 4/5) Left Ankle Dorsiflexion: 5/5 (was 4/5) Lumbar AROM Overall Lumbar AROM Comments: able to lay prone now (was unable)     Physical Therapy Assessment and Plan PT Assessment and Plan Clinical Impression Statement: Pt has completed 10 PT visits for Low back pain.  Pt has met 2/4 STG's and is progressing towards LTG's.  Pt/spouse admits to patient not being fully compliant with HEP, however reports more postural awareness and overall stronger.  Pt would benefit from continued therapy to further progress strength, reduce pain and meet goals  Pt will benefit from  skilled therapeutic intervention in order to improve on the following deficits: Abnormal gait;Decreased activity tolerance;Decreased balance;Difficulty walking;Decreased strength;Pain;Impaired flexibility;Obesity;Decreased range of motion;Improper body mechanics;Decreased mobility PT Frequency: Min 2X/week PT Duration: 4 weeks PT Treatment/Interventions: Gait training;Stair training;Functional mobility training;Therapeutic activities;Therapeutic exercise;Balance training;Modalities;Patient/family education PT Plan: Recommend to continue therapy X 4 more weeks.    Goals PT Short Term Goals PT Short Term Goal 1: Patient will be able to extend hips >5 degrees to stand upright PT Short Term Goal 1 - Progress: Progressing toward goal PT Short Term Goal 2: Patient will be able to perform straight led raise to >60 degrees bilaterally to improved hamstrign mobility so patient can more easily bend forward PT Short Term Goal 2 - Progress: Met PT Short Term Goal 3: Patient will increase groin muscle mobility so patient does not have pain when sittign with ankle over opposite knee (no longer has pain, just feels tightness) PT Short Term Goal 3 - Progress: Met PT Short Term Goal 4: Patient will increase abdominal muscle srength to 3/5MMT to decrease pain with prolonged standing > 46mnutes to <5/10 PT Short Term Goal 4 - Progress: Progressing toward goal PT Long Term Goals PT Long Term Goal 1 - Progress: Progressing toward goal PT Long Term Goal 2 - Progress: Progressing toward goal Long Term Goal 3 Progress: Progressing toward goal  Problem List Patient Active Problem List   Diagnosis Date Noted  . Lumbago 04/02/2014  . Abnormal posture 04/02/2014  . Muscle weakness (generalized) 04/02/2014  . Stiffness of joint, not elsewhere classified,  pelvic region and thigh 04/02/2014  . Normocytic anemia 03/14/2013  . Hx of adenomatous colonic polyps 03/13/2013  . Esophageal dysphagia 03/13/2013  .  Pancreatic cyst 03/13/2013    PT - End of Session Activity Tolerance: Patient tolerated treatment well General Behavior During Therapy: WFL for tasks assessed/performed  GP Functional Assessment Tool Used: FOTO 42% limited (was 50% limited)  Functional Limitation: Changing and maintaining body position Changing and Maintaining Body Position Current Status (N3614): At least 40 percent but less than 60 percent impaired, limited or restricted Changing and Maintaining Body Position Goal Status (E3154): At least 20 percent but less than 40 percent impaired, limited or restricted  Teena Irani, PTA/CLT 05/11/2014, 12:19 PM

## 2014-05-14 ENCOUNTER — Ambulatory Visit (HOSPITAL_COMMUNITY)
Admission: RE | Admit: 2014-05-14 | Discharge: 2014-05-14 | Disposition: A | Discharge status: Home / Self Care | Payer: Medicare Other | Source: Ambulatory Visit | Attending: Internal Medicine | Admitting: Internal Medicine

## 2014-05-14 DIAGNOSIS — Z5189 Encounter for other specified aftercare: Secondary | ICD-10-CM | POA: Diagnosis not present

## 2014-05-14 NOTE — Progress Notes (Signed)
Physical Therapy Treatment Patient Details  Name: Ian Mccann MRN: [id number] Date of Birth: Jul 10, 1929  Today's Date: 05/14/2014 Time: 8099-8338 PT Time Calculation (min): 40 min    Charges: TE [id number]  Visit#: 11 of 18  Re-eval: 06/07/14 Assessment Diagnosis: Low back pain secondary to limited trunk stability Next MD Visit: Willey Blade 06/14/14 [place] Prior Therapy: no  Authorization: BCBS- Medicare   Authorization Time Period: Gcode completed on 9th visit  Authorization Visit#: 11 of 20   Subjective: Symptoms/Limitations Symptoms: Patient states he has been feeling okay, notes conitnued low back pain. Patient notes pain has been better recently. Pain Assessment Currently in Pain?: Yes Pain Score: 2   Precautions/Restrictions     Exercise/Treatments Stretches Hip Flexor Stretch Limitations: 10x 3" onto 14" box Standing Functional Squats: 10 reps Functional Squats Limitations: Golfer squat with red ball 5x each Forward Lunge: 5 reps;Limitations Forward Lunge Limitations: 8" Other Standing Lumbar Exercises: Standing extension: 20x  Other Standing Lumbar Exercises: 3D Hip Excursion x10 Seated Other Seated Lumbar Exercises: Thoracic spine excursion 10x with arms over chest wiith therapist assist to increase ROM. Sagittal plane thoracic spine excursion with mEd ball overhead reach Other Seated Lumbar Exercises: STS without UE's 5 reps  Physical Therapy Assessment and Plan PT Assessment and Plan Clinical Impression Statement: Patient demosntstrated good performance of exercises this session though patient required multiple verle, visual, and tactile cue to correctly perform exercisess. patients spouse, this session notes he has been performing his exercises a little more regularly. Focus of therapy remained on improvign treunk and strength and improvign posture by improvign lumbar and thoracic spine extension, hip flexor mobility, glute strengthpatient noted decreased pain at end  of session.  PT Plan: Focus of therapy to remain on improving trunk strength and improving posture by improving lumbar and thoracic spine extension ROM and strength, hip flexor mobility, glute strength.    Goals PT Short Term Goals PT Short Term Goal 1: Patient will be able to extend hips >5 degrees to stand upright PT Short Term Goal 1 - Progress: Progressing toward goal PT Short Term Goal 4: Patient will increase abdominal muscle srength to 3/5MMT to decrease pain with prolonged standing > 46minutes to <5/10 PT Short Term Goal 4 - Progress: Progressing toward goal PT Long Term Goals PT Long Term Goal 1: Patient will be able to extend hips >15 degrees towalk with increased stride length PT Long Term Goal 1 - Progress: Progressing toward goal PT Long Term Goal 2: Patient will be able to perform straight led raise to >80 degrees bilaterally to improved hamstring mobility so patient can touch toes PT Long Term Goal 2 - Progress: Progressing toward goal Long Term Goal 3: Patient will increase abdominal muscle srength to 4/5MMT to decrease pain with prolonged standing > 378minutes to <3/10 Long Term Goal 3 Progress: Progressing toward goal  Problem List Patient Active Problem List   Diagnosis Date Noted  . Lumbago 04/02/2014  . Abnormal posture 04/02/2014  . Muscle weakness (generalized) 04/02/2014  . Stiffness of joint, not elsewhere classified, pelvic region and thigh 04/02/2014  . Normocytic anemia 03/14/2013  . Hx of adenomatous colonic polyps 03/13/2013  . Esophageal dysphagia 03/13/2013  . Pancreatic cyst 03/13/2013   PT - End of Session Activity Tolerance: Patient tolerated treatment well General Behavior During Therapy: [facility] for tasks assessed/performed  GP    Zaul Hubers R 05/14/2014, 3:00 PM

## 2014-05-18 ENCOUNTER — Telehealth (HOSPITAL_COMMUNITY): Payer: Self-pay

## 2014-05-18 ENCOUNTER — Ambulatory Visit (HOSPITAL_COMMUNITY): Payer: Medicare Other | Admitting: Physical Therapy

## 2014-05-18 NOTE — Telephone Encounter (Signed)
cx - took son to [place] and didn't feel like coming to therapy

## 2014-05-21 ENCOUNTER — Ambulatory Visit (HOSPITAL_COMMUNITY): Payer: Medicare Other | Admitting: Physical Therapy

## 2014-05-25 ENCOUNTER — Ambulatory Visit (HOSPITAL_COMMUNITY)
Admission: RE | Admit: 2014-05-25 | Discharge: 2014-05-25 | Disposition: A | Discharge status: Home / Self Care | Payer: Medicare Other | Source: Ambulatory Visit | Attending: Internal Medicine | Admitting: Internal Medicine

## 2014-05-25 DIAGNOSIS — Z5189 Encounter for other specified aftercare: Secondary | ICD-10-CM | POA: Diagnosis not present

## 2014-05-25 NOTE — Progress Notes (Signed)
Physical Therapy Treatment Patient Details  Name: Ian Mccann MRN: [id number] Date of Birth: April 27, 1929  Today's Date: 05/25/2014 Time: 1105-1150 PT Time Calculation (min): 45 min Visit#: 12 of 18  Re-eval: 06/07/14 Authorization: BCBS- Medicare   Authorization Time Period: Gcode completed on 9th visit  Authorization Visit#: 12 of 20  Charges: therex 42'  Subjective: Symptoms/Limitations Symptoms: Pt and spouse reports increased compliance with HEP.   spouse states he complains less of back pain.  Currently without pain. Pain Assessment Currently in Pain?: No/denies   Exercise/Treatments Stretches Active Hamstring Stretch: 3 reps;30 seconds;Limitations Active Hamstring Stretch Limitations: On 14" Box Hip Flexor Stretch Limitations: 2X30" onto 14" box Piriformis Stretch: 1 rep;60 seconds Piriformis Stretch Limitations: Seated  Aerobic Stationary Bike: at end:  LE only, nustep level 3 hills 3 10' minutes, SPM >60 Standing Functional Squats: 10 reps Functional Squats Limitations: Golfer squat with red ball 5x each Forward Lunge: Limitations;10 reps Forward Lunge Limitations: 6" bilaterally with UE assist Seated Other Seated Lumbar Exercises: STS without UE's 5 reps    Physical Therapy Assessment and Plan PT Assessment and Plan Clinical Impression Statement: Progressed to lunges using 6" step today and able to complete with therapist facilitation for form and use of UE's for stabilty.  Added functional squats today with good form and able to complete without use of UEs.  Pt required 2 seated rest breaks during session today due to fatigue. Completed session with nustep to increase activity tolerance and LE strength/endurance.  Continues to require postural cues with exercises. PT Plan: Focus of therapy to remain on improving trunk strength and improving posture by improving lumbar and thoracic spine extension ROM and strength, hip flexor mobility, glute strength.  Add standing  UE flexion against wall next visit.      Problem List Patient Active Problem List   Diagnosis Date Noted  . Lumbago 04/02/2014  . Abnormal posture 04/02/2014  . Muscle weakness (generalized) 04/02/2014  . Stiffness of joint, not elsewhere classified, pelvic region and thigh 04/02/2014  . Normocytic anemia 03/14/2013  . Hx of adenomatous colonic polyps 03/13/2013  . Esophageal dysphagia 03/13/2013  . Pancreatic cyst 03/13/2013    PT - End of Session Activity Tolerance: Patient tolerated treatment well General Behavior During Therapy: [facility] for tasks assessed/performed   Teena Irani, PTA/CLT 05/25/2014, 11:47 AM

## 2014-05-27 ENCOUNTER — Ambulatory Visit (HOSPITAL_COMMUNITY)
Admission: RE | Admit: 2014-05-27 | Discharge: 2014-05-27 | Disposition: A | Discharge status: Home / Self Care | Payer: Medicare Other | Source: Ambulatory Visit | Attending: Physical Therapy | Admitting: Physical Therapy

## 2014-05-27 DIAGNOSIS — Z5189 Encounter for other specified aftercare: Secondary | ICD-10-CM | POA: Diagnosis not present

## 2014-05-27 NOTE — Progress Notes (Signed)
Physical Therapy Treatment Patient Details  Name: Ian Mccann MRN: [id number] Date of Birth: Nov 14, 1928  Today's Date: 05/27/2014 Time: 3818-2993 PT Time Calculation (min): 49 min 40' TE  Visit#: 13 of    Re-eval: 06/07/14    Authorization: BCBS- Medicare   Authorization Time Period: Gcode completed on 9th visit  Authorization Visit#: 13 of 20   Subjective: Symptoms/Limitations Symptoms: Pt enters gym pain free;Pt reports daily stretching zt home Pain Assessment Pain Score: 0-No pain     Exercise/Treatments       Stretches Active Hamstring Stretch: 3 reps;30 seconds;Limitations Active Hamstring Stretch Limitations: On 14" Box Hip Flexor Stretch Limitations: 2X30" onto 14" box Standing Extension: 30 seconds;2 reps;Limitations (facing wall) Piriformis Stretch: 1 rep;60 seconds Piriformis Stretch Limitations: Seated    Standing Functional Squats:  (see wall slides) Functional Squats Limitations: Golfer squat with red ball 3x each Forward Lunge: Limitations (with BUE flex and cervical ext to promote thoracic ext again) Forward Lunge Limitations: 6" bilaterally with UE assist Wall Slides: Limitations (x10 rather than sqauts to promote thoracic ext) Seated Other Seated Lumbar Exercises: STS without UE's 5 reps       Physical Therapy Assessment and Plan PT Assessment and Plan Clinical Impression Statement: Patient required several seated rest breaks today due to fatigue. Added UE flexion wall stretch and squats (wall slides) to promote thoracic ext as well as UE flex with cervical ext during fwd lunge, very fatiguing for paitent but he did well. PT Plan: Continue with PT POC to Focus  therapy  on improving trunk strength and improving posture by improving lumbar and thoracic spine extension ROM and strength, hip flexor mobility, glut strength.      Goals    Problem List Patient Active Problem List   Diagnosis Date Noted  . Lumbago 04/02/2014  . Abnormal posture  04/02/2014  . Muscle weakness (generalized) 04/02/2014  . Stiffness of joint, not elsewhere classified, pelvic region and thigh 04/02/2014  . Normocytic anemia 03/14/2013  . Hx of adenomatous colonic polyps 03/13/2013  . Esophageal dysphagia 03/13/2013  . Pancreatic cyst 03/13/2013    PT - End of Session Equipment Utilized During Treatment: Gait belt  GP    Muath Hallam, [place] 05/27/2014, 3:41 PM

## 2014-05-29 ENCOUNTER — Ambulatory Visit (HOSPITAL_COMMUNITY): Payer: Medicare Other | Admitting: Physical Therapy

## 2014-06-01 ENCOUNTER — Ambulatory Visit (HOSPITAL_COMMUNITY)
Admission: RE | Admit: 2014-06-01 | Discharge: 2014-06-01 | Disposition: A | Discharge status: Home / Self Care | Payer: Medicare Other | Source: Ambulatory Visit | Attending: Internal Medicine | Admitting: Internal Medicine

## 2014-06-01 DIAGNOSIS — Z5189 Encounter for other specified aftercare: Secondary | ICD-10-CM | POA: Diagnosis not present

## 2014-06-01 NOTE — Progress Notes (Signed)
Physical Therapy Treatment Patient Details  Name: Ian Mccann MRN: [id number] Date of Birth: Feb 07, 1929  Today's Date: 06/01/2014 Time: 1105-1150 PT Time Calculation (min): 45 min Visit#: 14 of 18  Re-eval: 06/07/14 Charges:  therex 72  Authorization: BCBS- Medicare   Authorization Time Period: Gcode completed on 9th visit  Authorization Visit#: 14 of 20   Subjective: Symptoms/Limitations Symptoms: Pt states he had some mild back pain this morning, approximately 5/10.  Pt states it never gets higher than that.  Spouse states he hasn't complained about his back hurting in a while. Pain Assessment Currently in Pain?: No/denies  Exercise/Treatments Stretches Active Hamstring Stretch: 3 reps;30 seconds;Limitations Active Hamstring Stretch Limitations: On 14" Box Hip Flexor Stretch Limitations: 2X30" onto 14" box Piriformis Stretch: 1 rep;60 seconds Piriformis Stretch Limitations: Seated  Aerobic Stationary Bike: nustep level 4 hills 3 10' minutes, SPM >50 Standing Forward Lunge: Limitations Forward Lunge Limitations: 6" bilaterally with UE assist Wall Slides: 10 reps Seated Other Seated Lumbar Exercises: STS without UE's 5 reps Supine Ab Set: 5 seconds     Physical Therapy Assessment and Plan PT Assessment and Plan Clinical Impression Statement: Patient required 3 seated rest breaks during session due to fatigue.  Patient with difficulty keeping back against wall with UE flexion and required cues to keep head from coming forward.  Pt able to complete actviteis with therapist facilitation for motivation and form.   PT Plan: Continue with PT POC to Focus  therapy  on improving trunk strength and improving posture by improving lumbar and thoracic spine extension ROM and strength, hip flexor mobility, glut strength.      Goals    Problem List Patient Active Problem List   Diagnosis Date Noted  . Lumbago 04/02/2014  . Abnormal posture 04/02/2014  . Muscle weakness  (generalized) 04/02/2014  . Stiffness of joint, not elsewhere classified, pelvic region and thigh 04/02/2014  . Normocytic anemia 03/14/2013  . Hx of adenomatous colonic polyps 03/13/2013  . Esophageal dysphagia 03/13/2013  . Pancreatic cyst 03/13/2013    PT - End of Session Equipment Utilized During Treatment: Gait belt  GP    Teena Irani, PTA/CLT 06/01/2014, 11:54 AM

## 2014-06-05 ENCOUNTER — Ambulatory Visit (HOSPITAL_COMMUNITY)
Admission: RE | Admit: 2014-06-05 | Discharge: 2014-06-05 | Disposition: A | Discharge status: Home / Self Care | Payer: Medicare Other | Source: Ambulatory Visit | Attending: Internal Medicine | Admitting: Internal Medicine

## 2014-06-05 DIAGNOSIS — Z5189 Encounter for other specified aftercare: Secondary | ICD-10-CM | POA: Diagnosis not present

## 2014-06-05 NOTE — Progress Notes (Signed)
Physical Therapy Treatment Patient Details  Name: PABLO STAUFFER MRN: [id number] Date of Birth: 1929-04-07  Today's Date: 06/05/2014 Time: 1110-1158 PT Time Calculation (min): 48 min Charge: TE [id number]  Visit#: 15 of 18  Re-eval: 06/07/14 Assessment Diagnosis: Low back pain secondary to limited trunk stability Next MD Visit: Willey Blade 06/14/14 [place] Prior Therapy: no  Authorization: BCBS- Medicare   Authorization Time Period: Gcode completed on 9th visit  Authorization Visit#: 15 of 20   Subjective: Symptoms/Limitations Symptoms: LBP scale 3/10 today  Wife stated that husband seems to be getting around a little easer and increased awareness with posture. Pain Assessment Currently in Pain?: Yes Pain Score: 3  Pain Location: Back Pain Orientation: Lower  Objective:  Exercise/Treatments Stretches Active Hamstring Stretch: 3 reps;30 seconds;Limitations Active Hamstring Stretch Limitations: On 14" Box Hip Flexor Stretch: 3 reps;20 seconds;Limitations Hip Flexor Stretch Limitations: 2X30" onto 14" box Standing Extension: Limitations Standing Extension Limitations: 3D hip excursion Piriformis Stretch: 2 reps;30 seconds Piriformis Stretch Limitations: Seated  Aerobic Stationary Bike: nustep level 4 hills 3 10' minutes, SPM >50 Standing Other Standing Lumbar Exercises: gastroc stretch 3x 30" Other Standing Lumbar Exercises: 3D Hip Excursion x10 Seated Other Seated Lumbar Exercises: Thoracic excursion10x eyes following Bil UE  Other Seated Lumbar Exercises: 10 STS no HHA    Physical Therapy Assessment and Plan PT Assessment and Plan Clinical Impression Statement: Pt improving awareness of posture and increasing activity tolerance, 2 rest breaks required through sessoin today.  Pt completed seated piriformis stretches and thoracic excursion during rest breaks today.  Therapy focus on improving hip mobility and awareness of posture, less therapist facilitaiotn required with  stretches and to look up during exercises.   PT Plan: Continue with PT POC to Focus  therapy  on improving trunk strength and improving posture by improving lumbar and thoracic spine extension ROM and strength, hip flexor mobility, glut strength.      Goals PT Short Term Goals PT Short Term Goal 1: Patient will be able to extend hips >5 degrees to stand upright PT Short Term Goal 1 - Progress: Progressing toward goal PT Short Term Goal 2: Patient will be able to perform straight led raise to >60 degrees bilaterally to improved hamstrign mobility so patient can more easily bend forward PT Short Term Goal 2 - Progress: Met PT Short Term Goal 3: Patient will increase groin muscle mobility so patient does not have pain when sittign with ankle over opposite knee PT Short Term Goal 3 - Progress: Met PT Short Term Goal 4: Patient will increase abdominal muscle srength to 3/5MMT to decrease pain with prolonged standing > 45mnutes to <5/10 PT Short Term Goal 4 - Progress: Progressing toward goal PT Long Term Goals PT Long Term Goal 1: Patient will be able to extend hips >15 degrees towalk with increased stride length PT Long Term Goal 1 - Progress: Progressing toward goal PT Long Term Goal 2: Patient will be able to perform straight led raise to >80 degrees bilaterally to improved hamstring mobility so patient can touch toes PT Long Term Goal 2 - Progress: Progressing toward goal Long Term Goal 3: Patient will increase abdominal muscle srength to 4/5MMT to decrease pain with prolonged standing > 3056mutes to <3/10 Long Term Goal 3 Progress: Progressing toward goal  Problem List Patient Active Problem List   Diagnosis Date Noted  . Lumbago 04/02/2014  . Abnormal posture 04/02/2014  . Muscle weakness (generalized) 04/02/2014  . Stiffness of joint, not elsewhere classified, pelvic region  and thigh 04/02/2014  . Normocytic anemia 03/14/2013  . Hx of adenomatous colonic polyps 03/13/2013  .  Esophageal dysphagia 03/13/2013  . Pancreatic cyst 03/13/2013    PT - End of Session Activity Tolerance: Patient tolerated treatment well General Behavior During Therapy: [facility] for tasks assessed/performed  GP    Aldona Lento 06/05/2014, 4:27 PM

## 2014-06-09 ENCOUNTER — Ambulatory Visit (HOSPITAL_COMMUNITY): Payer: Medicare Other | Admitting: Physical Therapy

## 2014-06-10 ENCOUNTER — Ambulatory Visit (HOSPITAL_COMMUNITY): Payer: Medicare Other | Admitting: Physical Therapy

## 2014-06-16 ENCOUNTER — Ambulatory Visit (HOSPITAL_COMMUNITY): Payer: Medicare Other | Admitting: Physical Therapy

## 2014-06-18 ENCOUNTER — Ambulatory Visit (HOSPITAL_COMMUNITY): Payer: Medicare Other | Admitting: Physical Therapy

## 2014-06-23 ENCOUNTER — Ambulatory Visit (HOSPITAL_COMMUNITY): Payer: Medicare Other | Admitting: Physical Therapy

## 2014-06-25 ENCOUNTER — Encounter (HOSPITAL_COMMUNITY): Payer: Self-pay | Admitting: Physical Therapy

## 2014-06-25 ENCOUNTER — Ambulatory Visit (HOSPITAL_COMMUNITY)
Admission: RE | Admit: 2014-06-25 | Payer: Medicare Other | Source: Ambulatory Visit | Attending: Internal Medicine | Admitting: Internal Medicine

## 2014-06-25 ENCOUNTER — Telehealth (HOSPITAL_COMMUNITY): Payer: Self-pay | Admitting: Physical Therapy

## 2014-06-25 NOTE — Telephone Encounter (Signed)
Wife called to say: D/C due to patient not feeling well and not able to keep apptments

## 2014-06-25 NOTE — Therapy (Signed)
  Patient Details  Name: Ian Mccann MRN: [id number] Date of Birth: [birth year]  Encounter Date: 06/25/2014  PHYSICAL THERAPY DISCHARGE SUMMARY  Visits from Start of Care: 15  Current functional level related to goals / functional outcomes:  Goals PT Short Term Goals PT Short Term Goal 1: Patient will be able to extend hips >5 degrees to stand upright PT Short Term Goal 1 - Progress: No met PT Short Term Goal 2: Patient will be able to perform straight led raise to >60 degrees bilaterally to improved hamstrign mobility so patient can more easily bend forward PT Short Term Goal 2 - Progress: Met PT Short Term Goal 3: Patient will increase groin muscle mobility so patient does not have pain when sittign with ankle over opposite knee PT Short Term Goal 3 - Progress: Met PT Short Term Goal 4: Patient will increase abdominal muscle srength to 3/5MMT to decrease pain with prolonged standing > 63mnutes to <5/10 PT Short Term Goal 4 - Progress: Not Met PT Long Term Goals PT Long Term Goal 1: Patient will be able to extend hips >15 degrees towalk with increased stride length PT Long Term Goal 1 - Progress: Not Met PT Long Term Goal 2: Patient will be able to perform straight led raise to >80 degrees bilaterally to improved hamstring mobility so patient can touch toes PT Long Term Goal 2 - Progress: Not Met Long Term Goal 3: Patient will increase abdominal muscle srength to 4/5MMT to decrease pain with prolonged standing > 3036mutes to <3/10 Long Term Goal 3 Progress: Not met    Plan: Patient agrees to discharge.  Patient goals were not met. Patient is being discharged due to not returning since the last visit.  ?????       CaDevona KonigT DPT [phone]

## 2014-06-30 ENCOUNTER — Ambulatory Visit (HOSPITAL_COMMUNITY): Payer: Medicare Other | Admitting: Physical Therapy

## 2014-07-01 ENCOUNTER — Ambulatory Visit (HOSPITAL_COMMUNITY): Payer: Medicare Other | Admitting: Physical Therapy

## 2014-07-07 ENCOUNTER — Ambulatory Visit (INDEPENDENT_AMBULATORY_CARE_PROVIDER_SITE_OTHER): Payer: Medicare Other | Admitting: Urology

## 2014-07-07 DIAGNOSIS — N3941 Urge incontinence: Secondary | ICD-10-CM

## 2014-07-07 DIAGNOSIS — C61 Malignant neoplasm of prostate: Secondary | ICD-10-CM

## 2014-12-28 ENCOUNTER — Encounter (HOSPITAL_COMMUNITY): Payer: Self-pay | Admitting: *Deleted

## 2014-12-28 ENCOUNTER — Emergency Department (HOSPITAL_COMMUNITY): Payer: Medicare Other

## 2014-12-28 ENCOUNTER — Emergency Department (HOSPITAL_COMMUNITY)
Admission: EM | Admit: 2014-12-28 | Discharge: 2014-12-28 | Disposition: A | Discharge status: Home / Self Care | Payer: Medicare Other | Attending: Emergency Medicine | Admitting: Emergency Medicine

## 2014-12-28 DIAGNOSIS — I10 Essential (primary) hypertension: Secondary | ICD-10-CM | POA: Diagnosis not present

## 2014-12-28 DIAGNOSIS — K219 Gastro-esophageal reflux disease without esophagitis: Secondary | ICD-10-CM | POA: Insufficient documentation

## 2014-12-28 DIAGNOSIS — R0789 Other chest pain: Secondary | ICD-10-CM | POA: Insufficient documentation

## 2014-12-28 DIAGNOSIS — R200 Anesthesia of skin: Secondary | ICD-10-CM

## 2014-12-28 DIAGNOSIS — M1990 Unspecified osteoarthritis, unspecified site: Secondary | ICD-10-CM | POA: Diagnosis not present

## 2014-12-28 DIAGNOSIS — Z791 Long term (current) use of non-steroidal anti-inflammatories (NSAID): Secondary | ICD-10-CM | POA: Insufficient documentation

## 2014-12-28 DIAGNOSIS — E785 Hyperlipidemia, unspecified: Secondary | ICD-10-CM | POA: Diagnosis not present

## 2014-12-28 DIAGNOSIS — M62838 Other muscle spasm: Secondary | ICD-10-CM | POA: Insufficient documentation

## 2014-12-28 DIAGNOSIS — Z8546 Personal history of malignant neoplasm of prostate: Secondary | ICD-10-CM | POA: Insufficient documentation

## 2014-12-28 DIAGNOSIS — E119 Type 2 diabetes mellitus without complications: Secondary | ICD-10-CM | POA: Diagnosis not present

## 2014-12-28 DIAGNOSIS — Z87442 Personal history of urinary calculi: Secondary | ICD-10-CM | POA: Diagnosis not present

## 2014-12-28 DIAGNOSIS — Z79899 Other long term (current) drug therapy: Secondary | ICD-10-CM | POA: Insufficient documentation

## 2014-12-28 DIAGNOSIS — Z872 Personal history of diseases of the skin and subcutaneous tissue: Secondary | ICD-10-CM | POA: Diagnosis not present

## 2014-12-28 DIAGNOSIS — Z8669 Personal history of other diseases of the nervous system and sense organs: Secondary | ICD-10-CM | POA: Insufficient documentation

## 2014-12-28 DIAGNOSIS — Z7982 Long term (current) use of aspirin: Secondary | ICD-10-CM | POA: Diagnosis not present

## 2014-12-28 DIAGNOSIS — R531 Weakness: Secondary | ICD-10-CM | POA: Diagnosis present

## 2014-12-28 LAB — CBC WITH DIFFERENTIAL/PLATELET
BASOS PCT: 1 % (ref 0–1)
Basophils Absolute: 0 10*3/uL (ref 0.0–0.1)
Eosinophils Absolute: 0.1 10*3/uL (ref 0.0–0.7)
Eosinophils Relative: 2 % (ref 0–5)
HCT: 36 % — ABNORMAL LOW (ref 39.0–52.0)
Hemoglobin: 12.2 g/dL — ABNORMAL LOW (ref 13.0–17.0)
Lymphocytes Relative: 24 % (ref 12–46)
Lymphs Abs: 1.4 10*3/uL (ref 0.7–4.0)
MCH: 29.8 pg (ref 26.0–34.0)
MCHC: 33.9 g/dL (ref 30.0–36.0)
MCV: 87.8 fL (ref 78.0–100.0)
Monocytes Absolute: 0.8 10*3/uL (ref 0.1–1.0)
Monocytes Relative: 13 % — ABNORMAL HIGH (ref 3–12)
NEUTROS ABS: 3.7 10*3/uL (ref 1.7–7.7)
NEUTROS PCT: 60 % (ref 43–77)
Platelets: 191 10*3/uL (ref 150–400)
RBC: 4.1 MIL/uL — ABNORMAL LOW (ref 4.22–5.81)
RDW: 13.6 % (ref 11.5–15.5)
WBC: 6 10*3/uL (ref 4.0–10.5)

## 2014-12-28 LAB — COMPREHENSIVE METABOLIC PANEL
ALT: 26 U/L (ref 17–63)
AST: 26 U/L (ref 15–41)
Albumin: 3.7 g/dL (ref 3.5–5.0)
Alkaline Phosphatase: 74 U/L (ref 38–126)
Anion gap: 9 (ref 5–15)
BUN: 17 mg/dL (ref 6–20)
CALCIUM: 8.6 mg/dL — AB (ref 8.9–10.3)
CO2: 27 mmol/L (ref 22–32)
Chloride: 102 mmol/L (ref 101–111)
Creatinine, Ser: 1.27 mg/dL — ABNORMAL HIGH (ref 0.61–1.24)
GFR, EST AFRICAN AMERICAN: 59 mL/min — AB (ref >60)
GFR, EST NON AFRICAN AMERICAN: 50 mL/min — AB (ref >60)
Glucose, Bld: 224 mg/dL — ABNORMAL HIGH (ref 65–99)
POTASSIUM: 4 mmol/L (ref 3.5–5.1)
Sodium: 138 mmol/L (ref 135–145)
Total Bilirubin: 0.5 mg/dL (ref 0.3–1.2)
Total Protein: 6.7 g/dL (ref 6.5–8.1)

## 2014-12-28 LAB — TROPONIN I: Troponin I: <0.03 ng/mL (ref <0.031)

## 2014-12-28 NOTE — Discharge Instructions (Signed)

## 2014-12-28 NOTE — ED Provider Notes (Signed)
CSN: [id number]     Arrival date & time 12/28/14  2008 History  This chart was scribed for Ian Greek, MD by Hansel Feinstein and Tula Nakayama, ED Scribe. This patient was seen in room [facility]19/[facility] and the patient's care was started at 8:26 PM.     Chief Complaint  Patient presents with  . Extremity Weakness   The history is provided by the patient and the spouse. No language interpreter was used.    HPI Comments: Ian Mccann is a 81 y.o. male who presents to the Emergency Department complaining of sudden onset, moderate, left hand pain that radiates to his left shoulder and left chest and started 2 hours PTA. Pt states symptoms started with numbness of his left hand followed by contraction of his fingers. He reports difficulty moving his fingers. Per wife, pt also complained of pain behind his eyes during the episode. She administered 2 aspirin PTA with no relief. Pt states that his feet have felt cold starting 6 months ago. He denies SOB, bilateral leg pain or weakness in his left leg as associated symptoms.  Past Medical History  Diagnosis Date  . Prostate cancer 08/2011    XRT  . Diabetes mellitus   . Hyperlipidemia   . Kidney stones   . Rosacea   . GERD (gastroesophageal reflux disease)   . Hypertension   . Arthritis   . Wears hearing aid   . HOH (hard of hearing)   . Full dentures   . Wears glasses     reading   Past Surgical History  Procedure Laterality Date  . Colonoscopy  2009    Dr. Rourk--> left-sided diverticulosis, splenic flexure polyp (tubular adenoma), cecal polyp.  . Esophagogastroduodenoscopy  2006    Dr. Rourk--> subtle submucosal ring status post dilation, varicose mucosa with focal erosions in the antrum  . Colonoscopy  2006    Dr. Guerry Minors  . Colonoscopy N/A 03/31/2013    Procedure: COLONOSCOPY;  Surgeon: Daneil Dolin, MD;  [facility];  Service: Endoscopy;  Laterality: N/A;  9:45  . Esophagogastroduodenoscopy (egd) with  esophageal dilation N/A 03/31/2013    Procedure: ESOPHAGOGASTRODUODENOSCOPY (EGD) WITH ESOPHAGEAL DILATION;  Surgeon: Daneil Dolin, MD;  Location: [facility];  Service: Endoscopy;  Laterality: N/A;  . Inguinal hernia repair      2 on rt-1 on lt  . Appendectomy    . Eye surgery      both cataracts  . Trigger finger release Right 10/22/2013    Procedure: RELEASE RIGHT MIDDLE FINGER TRIGGER A-1 PULLEY;  Surgeon: Wynonia Sours, MD;  Location: [place];  Service: Orthopedics;  Laterality: Right;  ANESTHESIA: IV REGIONAL/FAB   Family History  Problem Relation Age of Onset  . Colon cancer Neg Hx   . Cancer Brother     unknown   . Pancreatic cancer Brother    History  Substance Use Topics  . Smoking status: Never Smoker   . Smokeless tobacco: Not on file  . Alcohol Use: No    Review of Systems  Cardiovascular: Positive for chest pain.  Musculoskeletal: Positive for arthralgias ( left hand and arm).  Neurological: Positive for weakness ( left arm and hand ).  All other systems reviewed and are negative.   Allergies  Review of patient's allergies indicates no known allergies.  Home Medications   Prior to Admission medications   Medication Sig Start Date End Date Taking? Authorizing Provider  aspirin EC 325  MG tablet Take 650 mg by mouth once as needed for mild pain or moderate pain.   Yes Historical Provider, MD  aspirin EC 81 MG tablet Take 81 mg by mouth at bedtime.    Yes Historical Provider, MD  atorvastatin (LIPITOR) 20 MG tablet Take 20 mg by mouth every evening.  02/15/13  Yes Historical Provider, MD  Coenzyme Q10 (CO Q 10 PO) Take 100 mg by mouth at bedtime.    Yes Historical Provider, MD  donepezil (ARICEPT) 10 MG tablet Take 10 mg by mouth at bedtime.  02/15/13  Yes Historical Provider, MD  doxazosin (CARDURA) 4 MG tablet Take 4 mg by mouth at bedtime.  02/15/13  Yes Historical Provider, MD  furosemide (LASIX) 20 MG tablet Take 20 mg by mouth daily.   Yes  Historical Provider, MD  glimepiride (AMARYL) 1 MG tablet Take 2 mg by mouth daily after breakfast.  12/20/12  Yes Historical Provider, MD  lisinopril (PRINIVIL,ZESTRIL) 5 MG tablet Take 5 mg by mouth daily.   Yes Historical Provider, MD  loratadine (CLARITIN) 10 MG tablet Take 10 mg by mouth daily.   Yes Historical Provider, MD  metFORMIN (GLUCOPHAGE) 500 MG tablet Take 1,000 mg by mouth 2 (two) times daily with a meal.    Yes Historical Provider, MD  Misc Natural Products (OSTEO BI-FLEX ADV TRIPLE ST) TABS Take 1 tablet by mouth every morning.    Yes Historical Provider, MD  Multiple Vitamins-Minerals (CENTRUM SILVER PO) Take 1 tablet by mouth daily.    Yes Historical Provider, MD  naproxen sodium (ALEVE) 220 MG tablet Take 220 mg by mouth 2 (two) times daily as needed (for backache).   Yes Historical Provider, MD  omeprazole (PRILOSEC) 40 MG capsule Take 40 mg by mouth daily.  02/15/13  Yes Historical Provider, MD  potassium chloride (K-DUR) 10 MEQ tablet Take 10 mEq by mouth daily.   Yes Historical Provider, MD  UNKNOWN TO PATIENT Take 1 tablet by mouth daily. BLADDER-name is unknown   Yes Historical Provider, MD  HYDROcodone-acetaminophen (NORCO) 5-325 MG per tablet Take 1 tablet by mouth every 6 (six) hours as needed for moderate pain. Patient not taking: Reported on 12/28/2014 10/22/13   [name], MD   Triage VS: BP 152/64 mmHg  Pulse 85  Temp(Src) 98.7 F (37.1 C) (Oral)  Resp 16  Ht 5' 10.5" (1.791 m)  Wt 227 lb (102.967 kg)  BMI 32.10 kg/m2  SpO2 98% Physical Exam  Constitutional: He is oriented to person, place, and time. He appears well-developed and well-nourished. No distress.  HENT:  Head: Normocephalic and atraumatic.  Right Ear: Hearing normal.  Left Ear: Hearing normal.  Nose: Nose normal.  Mouth/Throat: Oropharynx is clear and moist and mucous membranes are normal.  Eyes: Conjunctivae and EOM are normal. Pupils are equal, round, and reactive to light.  Neck: Normal  range of motion. Neck supple.  Cardiovascular: Normal rate, regular rhythm, S1 normal and S2 normal.  Exam reveals no gallop and no friction rub.   No murmur heard. Pulmonary/Chest: Effort normal and breath sounds normal. No respiratory distress. He exhibits no tenderness.  Abdominal: Soft. Normal appearance and bowel sounds are normal. There is no hepatosplenomegaly. There is no tenderness. There is no rebound, no guarding, no tenderness at McBurney's point and negative Murphy's sign. No hernia.  Musculoskeletal: Normal range of motion. He exhibits tenderness. He exhibits no edema.  Pain with ROM of the left hand and fingers.   Neurological: He is  alert and oriented to person, place, and time. He has normal strength. No cranial nerve deficit or sensory deficit. Coordination normal. GCS eye subscore is 4. GCS verbal subscore is 5. GCS motor subscore is 6.  Skin: Skin is warm, dry and intact. No rash noted. No cyanosis or erythema.  Psychiatric: He has a normal mood and affect. His speech is normal and behavior is normal. Thought content normal.  Nursing note and vitals reviewed.   ED Course  Procedures (including critical care time) DIAGNOSTIC STUDIES: Oxygen Saturation is 98% on RA, normal by my interpretation.    COORDINATION OF CARE: 8:32 PM Discussed treatment plan with pt at bedside which includes CT of head w/o contrast, blood work, and an EKG. Pt agreed to plan.   Labs Review Labs Reviewed  CBC WITH DIFFERENTIAL/PLATELET - Abnormal; Notable for the following:    RBC 4.10 (*)    Hemoglobin 12.2 (*)    HCT 36.0 (*)    Monocytes Relative 13 (*)    All other components within normal limits  COMPREHENSIVE METABOLIC PANEL - Abnormal; Notable for the following:    Glucose, Bld 224 (*)    Creatinine, Ser 1.27 (*)    Calcium 8.6 (*)    GFR calc non Af Amer 50 (*)    GFR calc Af Amer 59 (*)    All other components within normal limits  TROPONIN I    Imaging Review Ct Head Wo  Contrast  12/28/2014   CLINICAL DATA:  Bone sided moderate left hand pain. Left-sided numbness.  EXAM: CT HEAD WITHOUT CONTRAST  TECHNIQUE: Contiguous axial images were obtained from the base of the skull through the vertex without intravenous contrast.  COMPARISON:  04/17/2013  FINDINGS: Skull and Sinuses:Chronic polypoid mucosal thickening in the inferior right nasal cavity. No sinus or mastoid effusion. No fracture or destructive process.  Orbits: Bilateral cataract resection.  No acute findings.  Brain: No evidence of acute infarction, hemorrhage, hydrocephalus, or mass lesion/mass effect. There is moderate chronic small vessel disease with patchy ischemic gliosis in the bilateral deep cerebral white matter. Chronic lacunar infarct noted in the posterior pons. Chronically indistinct deep gray nuclei. Cerebral volume is within normal limits for age.  IMPRESSION: 1. No acute intracranial findings. 2. Chronic small vessel disease without progression from 2014.   Electronically Signed   By: Monte Fantasia M.D.   On: 12/28/2014 21:41     EKG Interpretation None      ED ECG REPORT   Date: 12/28/2014  Rate: 83  Rhythm: normal sinus rhythm and premature atrial contractions (PAC)  QRS Axis: normal  Intervals: normal  ST/T Wave abnormalities: normal  Conduction Disutrbances:none  Narrative Interpretation:   Old EKG Reviewed: unchanged  I have personally reviewed the EKG tracing and agree with the computerized printout as noted.   MDM   Final diagnoses:  Left sided numbness   acute muscle spasm, left hand  Patient presents to the ER for evaluation of pain, numbness and tingling in the left arm. Patient reports that he had sudden onset of cramping in his hand. His fingers were drawn up and he could not move them. He was slowly able to start to move the fingers and hand again, but has had pain ever since. The pain is progressively improving, however. Upon examination in the ER, he has pain with  testing grip strength and movement of the arm, but he does not have any perceivable neurologic deficit. EKG is unremarkable. Troponin is normal.  Lab was normal. CT head was unremarkable.  Symptoms are extremely atypical for cardiac etiology. Likewise, he had significant pain in his extremities and was accompanied by spasms of the muscles of the hand, this is not seem consistent with CVA. CT unchanged from previous is reassuring. As patient is rapidly improving, will discharge, follow-up with primary doctor.  I personally performed the services described in this documentation, which was scribed in my presence. The recorded information has been reviewed and is accurate.    Ian Greek, MD 12/28/14 2200

## 2014-12-28 NOTE — ED Notes (Signed)
Pt states he had a sudden numbness/cramping in his left hand that radiated up his left arm into his left chest area. Pt states he feels like all of the "feeling" hasn't came back in his hand.

## 2015-01-12 ENCOUNTER — Ambulatory Visit (INDEPENDENT_AMBULATORY_CARE_PROVIDER_SITE_OTHER): Payer: Medicare Other | Admitting: Urology

## 2015-01-12 DIAGNOSIS — C61 Malignant neoplasm of prostate: Secondary | ICD-10-CM

## 2015-03-09 ENCOUNTER — Telehealth: Payer: Self-pay | Admitting: Internal Medicine

## 2015-03-09 NOTE — Telephone Encounter (Signed)
Letter in the mail 

## 2015-03-09 NOTE — Telephone Encounter (Signed)
September RECALL FOR CT ABD 1 YR

## 2015-03-16 ENCOUNTER — Other Ambulatory Visit: Payer: Self-pay

## 2015-03-16 DIAGNOSIS — K862 Cyst of pancreas: Secondary | ICD-10-CM

## 2015-03-17 NOTE — Telephone Encounter (Signed)
CT approved [id number] 03/17/2015-04/15/2015  Wife aware.   CT is set for 03/23/2015 @ 10:00 am at [facility]

## 2015-03-23 ENCOUNTER — Ambulatory Visit (HOSPITAL_COMMUNITY)
Admission: RE | Admit: 2015-03-23 | Discharge: 2015-03-23 | Disposition: A | Discharge status: Home / Self Care | Payer: Medicare Other | Source: Ambulatory Visit | Attending: Internal Medicine | Admitting: Internal Medicine

## 2015-03-23 DIAGNOSIS — K4090 Unilateral inguinal hernia, without obstruction or gangrene, not specified as recurrent: Secondary | ICD-10-CM | POA: Diagnosis not present

## 2015-03-23 DIAGNOSIS — K862 Cyst of pancreas: Secondary | ICD-10-CM | POA: Diagnosis not present

## 2015-03-23 LAB — POCT I-STAT CREATININE: CREATININE: 1.1 mg/dL (ref 0.61–1.24)

## 2015-03-23 MED ORDER — IOHEXOL 300 MG/ML  SOLN
100.0000 mL | Freq: Once | INTRAMUSCULAR | Status: AC | PRN
  Administered 2015-03-23: 100 mL via INTRAVENOUS

## 2015-07-13 ENCOUNTER — Ambulatory Visit (INDEPENDENT_AMBULATORY_CARE_PROVIDER_SITE_OTHER): Payer: Medicare Other | Admitting: Urology

## 2015-07-13 DIAGNOSIS — N3941 Urge incontinence: Secondary | ICD-10-CM | POA: Diagnosis not present

## 2015-07-13 DIAGNOSIS — C61 Malignant neoplasm of prostate: Secondary | ICD-10-CM

## 2015-12-20 ENCOUNTER — Ambulatory Visit (HOSPITAL_COMMUNITY)
Admission: RE | Admit: 2015-12-20 | Discharge: 2015-12-20 | Disposition: A | Discharge status: Home / Self Care | Payer: Medicare HMO | Source: Ambulatory Visit | Attending: Internal Medicine | Admitting: Internal Medicine

## 2015-12-20 ENCOUNTER — Other Ambulatory Visit (HOSPITAL_COMMUNITY): Payer: Self-pay | Admitting: Internal Medicine

## 2015-12-20 ENCOUNTER — Encounter: Payer: Self-pay | Admitting: Orthopedic Surgery

## 2015-12-20 ENCOUNTER — Ambulatory Visit (INDEPENDENT_AMBULATORY_CARE_PROVIDER_SITE_OTHER): Payer: Medicare HMO | Admitting: Orthopedic Surgery

## 2015-12-20 VITALS — BP 140/67 | HR 79 | Ht 71.0 in | Wt 237.0 lb

## 2015-12-20 DIAGNOSIS — S62112A Displaced fracture of triquetrum [cuneiform] bone, left wrist, initial encounter for closed fracture: Secondary | ICD-10-CM | POA: Diagnosis not present

## 2015-12-20 DIAGNOSIS — W19XXXA Unspecified fall, initial encounter: Secondary | ICD-10-CM | POA: Insufficient documentation

## 2015-12-20 DIAGNOSIS — M25532 Pain in left wrist: Secondary | ICD-10-CM | POA: Diagnosis present

## 2015-12-20 NOTE — Progress Notes (Addendum)
Chief Complaint  Patient presents with  . Wrist Injury    left wrist fracture, DOI 12/20/15   HPI 82 year old male presents for evaluation of the left wrist injury  X-ray shows triquetral fracture  Location left wrist quality dull severity moderate duration one day timing constant   ROS   Constitutional symptoms no fever or chills skin no skin changes poor healing   Past Medical History  Diagnosis Date  . Prostate cancer ([place]) 08/2011    XRT  . Diabetes mellitus   . Hyperlipidemia   . Kidney stones   . Rosacea   . GERD (gastroesophageal reflux disease)   . Hypertension   . Arthritis   . Wears hearing aid   . HOH (hard of hearing)   . Full dentures   . Wears glasses     reading    Past Surgical History  Procedure Laterality Date  . Colonoscopy  2009    Dr. Rourk--> left-sided diverticulosis, splenic flexure polyp (tubular adenoma), cecal polyp.  . Esophagogastroduodenoscopy  2006    Dr. Rourk--> subtle submucosal ring status post dilation, varicose mucosa with focal erosions in the antrum  . Colonoscopy  2006    Dr. Guerry Minors  . Colonoscopy N/A 03/31/2013    Procedure: COLONOSCOPY;  Surgeon: Daneil Dolin, MD;  [facility];  Service: Endoscopy;  Laterality: N/A;  9:45  . Esophagogastroduodenoscopy (egd) with esophageal dilation N/A 03/31/2013    Procedure: ESOPHAGOGASTRODUODENOSCOPY (EGD) WITH ESOPHAGEAL DILATION;  Surgeon: Daneil Dolin, MD;  [facility];  Service: Endoscopy;  Laterality: N/A;  . Inguinal hernia repair      2 on rt-1 on lt  . Appendectomy    . Eye surgery      both cataracts  . Trigger finger release Right 10/22/2013    Procedure: RELEASE RIGHT MIDDLE FINGER TRIGGER A-1 PULLEY;  Surgeon: Wynonia Sours, MD;  Location: [place];  Service: Orthopedics;  Laterality: Right;  ANESTHESIA: IV REGIONAL/FAB   Family History  Problem Relation Age of Onset  . Colon cancer Neg Hx   . Cancer Brother     unknown    . Pancreatic cancer Brother    Social History  Substance Use Topics  . Smoking status: Never Smoker   . Smokeless tobacco: None  . Alcohol Use: No    Current outpatient prescriptions:  .  aspirin EC 325 MG tablet, Take 650 mg by mouth once as needed for mild pain or moderate pain., Disp: , Rfl:  .  aspirin EC 81 MG tablet, Take 81 mg by mouth at bedtime. , Disp: , Rfl:  .  atorvastatin (LIPITOR) 20 MG tablet, Take 20 mg by mouth every evening. , Disp: , Rfl:  .  Coenzyme Q10 (CO Q 10 PO), Take 100 mg by mouth at bedtime. , Disp: , Rfl:  .  donepezil (ARICEPT) 10 MG tablet, Take 10 mg by mouth at bedtime. , Disp: , Rfl:  .  doxazosin (CARDURA) 4 MG tablet, Take 4 mg by mouth at bedtime. , Disp: , Rfl:  .  furosemide (LASIX) 20 MG tablet, Take 20 mg by mouth daily., Disp: , Rfl:  .  glimepiride (AMARYL) 1 MG tablet, Take 2 mg by mouth daily after breakfast. , Disp: , Rfl:  .  HYDROcodone-acetaminophen (NORCO) 5-325 MG per tablet, Take 1 tablet by mouth every 6 (six) hours as needed for moderate pain. (Patient not taking: Reported on 12/28/2014), Disp: 30 tablet, Rfl: 0 .  lisinopril (PRINIVIL,ZESTRIL) 5 MG tablet, Take 5 mg by mouth daily., Disp: , Rfl:  .  loratadine (CLARITIN) 10 MG tablet, Take 10 mg by mouth daily., Disp: , Rfl:  .  metFORMIN (GLUCOPHAGE) 500 MG tablet, Take 1,000 mg by mouth 2 (two) times daily with a meal. , Disp: , Rfl:  .  Misc Natural Products (OSTEO BI-FLEX ADV TRIPLE ST) TABS, Take 1 tablet by mouth every morning. , Disp: , Rfl:  .  Multiple Vitamins-Minerals (CENTRUM SILVER PO), Take 1 tablet by mouth daily. , Disp: , Rfl:  .  naproxen sodium (ALEVE) 220 MG tablet, Take 220 mg by mouth 2 (two) times daily as needed (for backache)., Disp: , Rfl:  .  omeprazole (PRILOSEC) 40 MG capsule, Take 40 mg by mouth daily. , Disp: , Rfl:  .  potassium chloride (K-DUR) 10 MEQ tablet, Take 10 mEq by mouth daily., Disp: , Rfl:  .  UNKNOWN TO PATIENT, Take 1 tablet by mouth  daily. BLADDER-name is unknown, Disp: , Rfl:   BP 140/67 mmHg  Pulse 79  Ht 5\' 11"  (1.803 m)  Wt 237 lb (107.502 kg)  BMI 33.07 kg/m2  Physical Exam  Constitutional: He is oriented to person, place, and time. He appears well-developed and well-nourished. No distress.  Musculoskeletal:  Ambulatory status supported by a cane slow walking decreased stride length chronic  Neurological: He is alert and oriented to person, place, and time.  Skin: He is not diaphoretic.  Psychiatric: He has a normal mood and affect.    Ortho Exam  Left wrist exam swollen and tenderness in the area fracture at this causes decreased range of motion. Wrist joint is stable. He has weakness in grip from chronic stiffness in the joints of the small fingers stability normal skin is slightly ecchymotic pulses are good sensation is normal  In comparison the right hand has no swelling tenderness or skin changes and range of motion deficits are mild strength deficits are mild ASSESSMENT: My personal interpretation of the images:  Fracture of the triquetral bone avulsion type fracture indicating ligamentous avulsion  Encounter Diagnosis  Name Primary?  . Triquetral chip fracture, left, closed, initial encounter Yes    PLAN Splint for 6 weeks  Follow-up x-rays not needed

## 2016-01-18 ENCOUNTER — Ambulatory Visit (INDEPENDENT_AMBULATORY_CARE_PROVIDER_SITE_OTHER): Payer: Medicare HMO | Admitting: Urology

## 2016-01-18 DIAGNOSIS — C61 Malignant neoplasm of prostate: Secondary | ICD-10-CM | POA: Diagnosis not present

## 2016-01-18 DIAGNOSIS — N3281 Overactive bladder: Secondary | ICD-10-CM | POA: Diagnosis not present

## 2016-01-31 ENCOUNTER — Other Ambulatory Visit: Payer: Self-pay | Admitting: *Deleted

## 2016-01-31 ENCOUNTER — Ambulatory Visit (INDEPENDENT_AMBULATORY_CARE_PROVIDER_SITE_OTHER): Payer: Medicare HMO | Admitting: Orthopedic Surgery

## 2016-01-31 ENCOUNTER — Encounter: Payer: Self-pay | Admitting: Orthopedic Surgery

## 2016-01-31 ENCOUNTER — Other Ambulatory Visit: Payer: Self-pay | Admitting: Orthopedic Surgery

## 2016-01-31 VITALS — BP 106/51 | Ht 71.0 in | Wt 230.0 lb

## 2016-01-31 DIAGNOSIS — S62112D Displaced fracture of triquetrum [cuneiform] bone, left wrist, subsequent encounter for fracture with routine healing: Secondary | ICD-10-CM | POA: Diagnosis not present

## 2016-01-31 MED ORDER — HYDROCODONE-ACETAMINOPHEN 5-325 MG PO TABS: 1.0000 | ORAL_TABLET | Freq: Four times a day (QID) | ORAL | Status: DC | PRN

## 2016-01-31 NOTE — Progress Notes (Signed)
Wrist Injury       left wrist fracture, DOI 12/20/15   HPI 82 year old male presents for evaluation of the left wrist injury  X-ray shows triquetral fracture  Location left wrist quality dull severity moderate duration one day timing constant   ROS   Constitutional symptoms no fever or chills skin no skin changes poor healing   Chief Complaint  Patient presents with  . Follow-up    left wrist fracture, DOI 12/20/15    Previous history noted above. Patient had splint on for 6 weeks were triquetral fracture  Complains of minimal discomfort  Review of systems denies numbness or tingling in the involved left hand  Exam shows minimal tenderness over the wrist joint and hand no swelling range of motion is returned to normal wrists feels stable muscle tone normal in the hand scans intact good distal pulse and normal sensation noted  Triquetral fracture resolved  Remove splint follow-up as needed

## 2016-07-25 ENCOUNTER — Ambulatory Visit (INDEPENDENT_AMBULATORY_CARE_PROVIDER_SITE_OTHER): Payer: Medicare HMO | Admitting: Urology

## 2016-07-25 DIAGNOSIS — C61 Malignant neoplasm of prostate: Secondary | ICD-10-CM

## 2016-11-02 DIAGNOSIS — E119 Type 2 diabetes mellitus without complications: Secondary | ICD-10-CM | POA: Diagnosis not present

## 2016-11-09 DIAGNOSIS — E1129 Type 2 diabetes mellitus with other diabetic kidney complication: Secondary | ICD-10-CM | POA: Diagnosis not present

## 2016-11-09 DIAGNOSIS — I10 Essential (primary) hypertension: Secondary | ICD-10-CM | POA: Diagnosis not present

## 2017-01-23 ENCOUNTER — Ambulatory Visit (INDEPENDENT_AMBULATORY_CARE_PROVIDER_SITE_OTHER): Payer: Medicare Other | Admitting: Urology

## 2017-01-23 DIAGNOSIS — N3281 Overactive bladder: Secondary | ICD-10-CM | POA: Diagnosis not present

## 2017-01-23 DIAGNOSIS — C61 Malignant neoplasm of prostate: Secondary | ICD-10-CM

## 2017-03-20 DIAGNOSIS — E1129 Type 2 diabetes mellitus with other diabetic kidney complication: Secondary | ICD-10-CM | POA: Diagnosis not present

## 2017-03-20 DIAGNOSIS — Z79899 Other long term (current) drug therapy: Secondary | ICD-10-CM | POA: Diagnosis not present

## 2017-03-20 DIAGNOSIS — E785 Hyperlipidemia, unspecified: Secondary | ICD-10-CM | POA: Diagnosis not present

## 2017-03-20 DIAGNOSIS — Z125 Encounter for screening for malignant neoplasm of prostate: Secondary | ICD-10-CM | POA: Diagnosis not present

## 2017-03-20 DIAGNOSIS — I10 Essential (primary) hypertension: Secondary | ICD-10-CM | POA: Diagnosis not present

## 2017-03-27 DIAGNOSIS — E1129 Type 2 diabetes mellitus with other diabetic kidney complication: Secondary | ICD-10-CM | POA: Diagnosis not present

## 2017-03-27 DIAGNOSIS — I10 Essential (primary) hypertension: Secondary | ICD-10-CM | POA: Diagnosis not present

## 2017-03-27 DIAGNOSIS — R079 Chest pain, unspecified: Secondary | ICD-10-CM | POA: Diagnosis not present

## 2017-03-27 DIAGNOSIS — Z0001 Encounter for general adult medical examination with abnormal findings: Secondary | ICD-10-CM | POA: Diagnosis not present

## 2017-05-07 DEATH — deceased

## 2017-07-17 ENCOUNTER — Ambulatory Visit: Payer: Medicare Other | Admitting: Urology
